# Patient Record
Sex: Female | Born: 1975 | Race: Black or African American | Hispanic: No | Marital: Single | Smoking: Never smoker
Health system: Southern US, Community
[De-identification: ages and names within clinical notes are randomized; demographics above are authoritative.]

## PROBLEM LIST (undated history)

## (undated) DIAGNOSIS — D509 Iron deficiency anemia, unspecified: Secondary | ICD-10-CM

## (undated) DIAGNOSIS — K589 Irritable bowel syndrome without diarrhea: Secondary | ICD-10-CM

## (undated) DIAGNOSIS — T7840XA Allergy, unspecified, initial encounter: Secondary | ICD-10-CM

## (undated) DIAGNOSIS — G43909 Migraine, unspecified, not intractable, without status migrainosus: Secondary | ICD-10-CM

## (undated) HISTORY — PX: CHOLECYSTECTOMY: SHX55

## (undated) HISTORY — PX: ANKLE ARTHROSCOPY: SUR85

---

## 1997-10-06 ENCOUNTER — Ambulatory Visit (HOSPITAL_COMMUNITY): Admission: RE | Admit: 1997-10-06 | Discharge: 1997-10-06 | Payer: Self-pay | Admitting: Gynecology

## 1997-11-01 ENCOUNTER — Other Ambulatory Visit: Admission: RE | Admit: 1997-11-01 | Discharge: 1997-11-01 | Payer: Self-pay | Admitting: Gynecology

## 1998-01-12 ENCOUNTER — Ambulatory Visit (HOSPITAL_COMMUNITY): Admission: RE | Admit: 1998-01-12 | Discharge: 1998-01-12 | Payer: Self-pay | Admitting: Internal Medicine

## 1998-01-19 ENCOUNTER — Ambulatory Visit (HOSPITAL_COMMUNITY): Admission: RE | Admit: 1998-01-19 | Discharge: 1998-01-19 | Payer: Self-pay | Admitting: Internal Medicine

## 1998-04-19 ENCOUNTER — Emergency Department (HOSPITAL_COMMUNITY): Admission: EM | Admit: 1998-04-19 | Discharge: 1998-04-19 | Payer: Self-pay | Admitting: Emergency Medicine

## 1998-07-03 ENCOUNTER — Emergency Department (HOSPITAL_COMMUNITY): Admission: EM | Admit: 1998-07-03 | Discharge: 1998-07-03 | Payer: Self-pay | Admitting: Emergency Medicine

## 1998-07-12 ENCOUNTER — Other Ambulatory Visit: Admission: RE | Admit: 1998-07-12 | Discharge: 1998-07-12 | Payer: Self-pay | Admitting: Gynecology

## 1999-05-10 ENCOUNTER — Other Ambulatory Visit: Admission: RE | Admit: 1999-05-10 | Discharge: 1999-05-10 | Payer: Self-pay | Admitting: Gynecology

## 1999-10-11 ENCOUNTER — Other Ambulatory Visit: Admission: RE | Admit: 1999-10-11 | Discharge: 1999-10-11 | Payer: Self-pay | Admitting: Internal Medicine

## 2000-05-21 ENCOUNTER — Other Ambulatory Visit: Admission: RE | Admit: 2000-05-21 | Discharge: 2000-05-21 | Payer: Self-pay | Admitting: Gynecology

## 2000-12-17 ENCOUNTER — Other Ambulatory Visit: Admission: RE | Admit: 2000-12-17 | Discharge: 2000-12-17 | Payer: Self-pay | Admitting: Gynecology

## 2001-08-11 ENCOUNTER — Other Ambulatory Visit: Admission: RE | Admit: 2001-08-11 | Discharge: 2001-08-11 | Payer: Self-pay | Admitting: Gynecology

## 2001-08-22 ENCOUNTER — Emergency Department (HOSPITAL_COMMUNITY): Admission: EM | Admit: 2001-08-22 | Discharge: 2001-08-23 | Payer: Self-pay | Admitting: Emergency Medicine

## 2001-08-23 ENCOUNTER — Encounter: Payer: Self-pay | Admitting: Emergency Medicine

## 2002-03-18 ENCOUNTER — Emergency Department (HOSPITAL_COMMUNITY): Admission: EM | Admit: 2002-03-18 | Discharge: 2002-03-19 | Payer: Self-pay | Admitting: Emergency Medicine

## 2002-05-24 ENCOUNTER — Ambulatory Visit (HOSPITAL_COMMUNITY): Admission: RE | Admit: 2002-05-24 | Discharge: 2002-05-24 | Payer: Self-pay | Admitting: Gynecology

## 2002-05-24 ENCOUNTER — Encounter: Payer: Self-pay | Admitting: Gynecology

## 2002-06-11 ENCOUNTER — Ambulatory Visit (HOSPITAL_COMMUNITY): Admission: RE | Admit: 2002-06-11 | Discharge: 2002-06-11 | Payer: Self-pay | Admitting: Gynecology

## 2018-01-20 ENCOUNTER — Emergency Department (HOSPITAL_COMMUNITY)
Admission: EM | Admit: 2018-01-20 | Discharge: 2018-01-20 | Disposition: A | Discharge status: Home / Self Care | Payer: Medicaid Other | Attending: Emergency Medicine | Admitting: Emergency Medicine

## 2018-01-20 ENCOUNTER — Encounter (HOSPITAL_COMMUNITY): Payer: Self-pay | Admitting: Emergency Medicine

## 2018-01-20 ENCOUNTER — Emergency Department (HOSPITAL_COMMUNITY): Payer: Medicaid Other

## 2018-01-20 ENCOUNTER — Other Ambulatory Visit: Payer: Self-pay

## 2018-01-20 DIAGNOSIS — S0592XA Unspecified injury of left eye and orbit, initial encounter: Secondary | ICD-10-CM | POA: Diagnosis not present

## 2018-01-20 DIAGNOSIS — S199XXA Unspecified injury of neck, initial encounter: Secondary | ICD-10-CM | POA: Diagnosis present

## 2018-01-20 DIAGNOSIS — Y929 Unspecified place or not applicable: Secondary | ICD-10-CM | POA: Diagnosis not present

## 2018-01-20 DIAGNOSIS — Y999 Unspecified external cause status: Secondary | ICD-10-CM | POA: Diagnosis not present

## 2018-01-20 DIAGNOSIS — Y939 Activity, unspecified: Secondary | ICD-10-CM | POA: Insufficient documentation

## 2018-01-20 LAB — I-STAT CHEM 8, ED
BUN: 7 mg/dL (ref 6–20)
Calcium, Ion: 1.2 mmol/L (ref 1.15–1.40)
Chloride: 103 mmol/L (ref 98–111)
Creatinine, Ser: 1.1 mg/dL — ABNORMAL HIGH (ref 0.44–1.00)
Glucose, Bld: 90 mg/dL (ref 70–99)
HCT: 37 % (ref 36.0–46.0)
Hemoglobin: 12.6 g/dL (ref 12.0–15.0)
Potassium: 3.4 mmol/L — ABNORMAL LOW (ref 3.5–5.1)
Sodium: 140 mmol/L (ref 135–145)
TCO2: 26 mmol/L (ref 22–32)

## 2018-01-20 LAB — I-STAT BETA HCG BLOOD, ED (MC, WL, AP ONLY): I-stat hCG, quantitative: <5 m[IU]/mL (ref <5)

## 2018-01-20 MED ORDER — IOPAMIDOL (ISOVUE-300) INJECTION 61%
75.0000 mL | Freq: Once | INTRAVENOUS | Status: AC | PRN
  Administered 2018-01-20: 75 mL via INTRAVENOUS
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
## 2018-01-20 NOTE — Discharge Instructions (Addendum)
Please read attached information. If you experience any new or worsening signs or symptoms please return to the emergency room for evaluation. Please follow-up with your primary care provider or specialist as discussed.  °

## 2018-01-20 NOTE — ED Notes (Signed)
ED Provider at bedside. 
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
## 2018-01-20 NOTE — ED Notes (Signed)
Patient verbalizes understanding of discharge instructions. Opportunity for questioning and answers were provided. Armband removed by staff, pt discharged from ED ambulatory.   
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
## 2018-02-02 ENCOUNTER — Encounter (HOSPITAL_COMMUNITY): Payer: Self-pay | Admitting: *Deleted

## 2018-02-02 ENCOUNTER — Emergency Department (HOSPITAL_COMMUNITY)
Admission: EM | Admit: 2018-02-02 | Discharge: 2018-02-02 | Disposition: A | Discharge status: Home / Self Care | Payer: Medicaid Other | Attending: Emergency Medicine | Admitting: Emergency Medicine

## 2018-02-02 DIAGNOSIS — J0101 Acute recurrent maxillary sinusitis: Secondary | ICD-10-CM | POA: Diagnosis not present

## 2018-02-02 DIAGNOSIS — J0100 Acute maxillary sinusitis, unspecified: Secondary | ICD-10-CM

## 2018-02-02 DIAGNOSIS — R05 Cough: Secondary | ICD-10-CM | POA: Diagnosis present

## 2018-02-02 MED ORDER — AMOXICILLIN-POT CLAVULANATE 875-125 MG PO TABS: 1.0000 | ORAL_TABLET | Freq: Two times a day (BID) | ORAL | 0 refills | Status: DC

## 2018-02-02 NOTE — ED Triage Notes (Signed)
Pt in stating she thinks she has a sinus infection, reports nasal congestion and facial pain, reports fever last night

## 2018-02-02 NOTE — Discharge Instructions (Addendum)
Please read attached information. If you experience any new or worsening signs or symptoms please return to the emergency room for evaluation. Please follow-up with your primary care provider or specialist as discussed. Please use medication prescribed only as directed and discontinue taking if you have any concerning signs or symptoms.   °

## 2018-02-02 NOTE — ED Provider Notes (Signed)
[facility] Provider Note   CSN: [id number] Arrival date & time: 02/02/18  1228     History   Chief Complaint Chief Complaint  Patient presents with  . Sinus Problem    HPI Alayah Vivi FernsM Uecker is a 42 y.o. female.  HPI   [age] year old female presents today with complaints of upper respiratory infection.  Patient notes symptoms started approximately 1 week ago with upper respiratory congestion and nasal pressure.  She notes this is localized to the left maxillary region, she notes cough with sputum production, she denies any associated shortness of breath, denies any fever.  Patient notes history of sinus infection in the past that presented similarly.  She was seen in urgent care and prescribed Flonase.  She notes she did not fill the prescription for the Flonase.  She denies any recent antibiotic exposure.  History reviewed. No pertinent past medical history.  There are no active problems to display for this patient.   Past Surgical History:  Procedure Laterality Date  . ANKLE ARTHROSCOPY    . CHOLECYSTECTOMY       OB History   None      Home Medications    Prior to Admission medications   Medication Sig Start Date End Date Taking? Authorizing Provider  amoxicillin-clavulanate (AUGMENTIN) 875-125 MG tablet Take 1 tablet by mouth every 12 (twelve) hours. 02/02/18   Eyvonne MechanicHedges, Charlestine Rookstool, PA-C    Family History History reviewed. No pertinent family history.  Social History Social History   Tobacco Use  . Smoking status: Never Smoker  . Smokeless tobacco: Never Used  Substance Use Topics  . Alcohol use: Never    Frequency: Never  . Drug use: Never     Allergies   Latex   Review of Systems Review of Systems  All other systems reviewed and are negative.   Physical Exam Updated Vital Signs BP (!) 154/91 (BP Location: Right Arm)   Pulse (!) 130   Temp 99 F (37.2 C) (Oral)   Resp 20   LMP 01/18/2018   SpO2 100%    Physical Exam  Constitutional: She is oriented to person, place, and time. She appears well-developed and well-nourished.  HENT:  Head: Normocephalic and atraumatic.  Bilateral TMs normal, oropharynx clear no swelling or edema or erythema-rhinorrhea nasal mucosal edema  Eyes: Pupils are equal, round, and reactive to light. Conjunctivae are normal. Right eye exhibits no discharge. Left eye exhibits no discharge. No scleral icterus.  Neck: Normal range of motion. No JVD present. No tracheal deviation present.  Pulmonary/Chest: Effort normal and breath sounds normal. No stridor. No respiratory distress. She has no wheezes. She has no rales. She exhibits no tenderness.  Neurological: She is alert and oriented to person, place, and time. Coordination normal.  Psychiatric: She has a normal mood and affect. Her behavior is normal. Judgment and thought content normal.  Nursing note and vitals reviewed.    ED Treatments / Results  Labs (all labs ordered are listed, but only abnormal results are displayed) Labs Reviewed - No data to display  EKG None  Radiology No results found.  Procedures Procedures (including critical care time)  Medications Ordered in ED Medications - No data to display   Initial Impression / Assessment and Plan / ED Course  I have reviewed the triage vital signs and the nursing notes.  Pertinent labs & imaging results that were available during my care of the patient were reviewed by me and considered in my  medical decision making (see chart for details).     42 year old female presents today with likely sinusitis.  She has had 1 week of symptoms they continue to worsen, concern for bacterial sinusitis.  Patient be treated with Augmentin, she has no signs of significant respiratory involvement low suspicion for pneumonia.  Patient discharged with antibiotics, Flonase, follow-up instructions and strict return precautions.  She verbalized understanding and  agreement to today's plan had no further questions or concerns.  Final Clinical Impressions(s) / ED Diagnoses   Final diagnoses:  Acute non-recurrent maxillary sinusitis    ED Discharge Orders         Ordered    amoxicillin-clavulanate (AUGMENTIN) 875-125 MG tablet  Every 12 hours     02/02/18 1343           Eyvonne Mechanic, PA-C 02/02/18 1345    Long, Arlyss Repress, MD 02/03/18 1108

## 2019-05-06 ENCOUNTER — Encounter (HOSPITAL_COMMUNITY): Payer: Self-pay | Admitting: Emergency Medicine

## 2019-05-06 ENCOUNTER — Emergency Department (HOSPITAL_COMMUNITY)
Admission: EM | Admit: 2019-05-06 | Discharge: 2019-05-06 | Disposition: A | Discharge status: Home / Self Care | Payer: Medicaid Other | Attending: Emergency Medicine | Admitting: Emergency Medicine

## 2019-05-06 DIAGNOSIS — Z91040 Latex allergy status: Secondary | ICD-10-CM | POA: Insufficient documentation

## 2019-05-06 DIAGNOSIS — J0100 Acute maxillary sinusitis, unspecified: Secondary | ICD-10-CM | POA: Diagnosis not present

## 2019-05-06 DIAGNOSIS — R519 Headache, unspecified: Secondary | ICD-10-CM | POA: Diagnosis present

## 2019-05-06 MED ORDER — AMOXICILLIN-POT CLAVULANATE 875-125 MG PO TABS: 1.0000 | ORAL_TABLET | Freq: Two times a day (BID) | ORAL | 0 refills | Status: DC

## 2019-05-06 MED ORDER — FLUCONAZOLE 150 MG PO TABS: 150.0000 mg | ORAL_TABLET | Freq: Every day | ORAL | 0 refills | Status: AC

## 2019-05-06 NOTE — Discharge Instructions (Signed)
Take Augmentin twice daily for 5 days. Take with food Use Flonase or Nasocort as directed to help open up the sinuses Please return if worsening

## 2019-05-06 NOTE — ED Provider Notes (Signed)
[facility] EMERGENCY DEPARTMENT Provider Note   CSN: [id number] Arrival date & time: 05/06/19  1037     History Chief Complaint  Patient presents with  . Facial Pain    Caroline Mcgee is a 43 y.o. female who presents with possible sinus infection.  She states that she gets recurrent sinusitis a couple times a year and symptoms feel exactly the same.  She reports congestion, her ears popping, facial pain, pressure behind her eyes.  Symptoms been going on for approximately 3 days.  She is blowing out yellow/green mucus.  She reports mild cough.  She denies fever, chest pain, shortness of breath.  She has not tried anything over-the-counter.  HPI     History reviewed. No pertinent past medical history.  There are no problems to display for this patient.   Past Surgical History:  Procedure Laterality Date  . ANKLE ARTHROSCOPY    . CHOLECYSTECTOMY       OB History   No obstetric history on file.     No family history on file.  Social History   Tobacco Use  . Smoking status: Never Smoker  . Smokeless tobacco: Never Used  Substance Use Topics  . Alcohol use: Never  . Drug use: Never    Home Medications Prior to Admission medications   Medication Sig Start Date End Date Taking? Authorizing Provider  amoxicillin-clavulanate (AUGMENTIN) 875-125 MG tablet Take 1 tablet by mouth every 12 (twelve) hours. 02/02/18   Okey Regal, PA-C    Allergies    Latex  Review of Systems   Review of Systems  Constitutional: Negative for fever.  HENT: Positive for congestion and sinus pressure.   Respiratory: Positive for cough.     Physical Exam Updated Vital Signs BP (!) 130/95 (BP Location: Right Arm)   Pulse 89   Temp 98.8 F (37.1 C) (Oral)   Resp 16   LMP 04/22/2019   SpO2 100%   Physical Exam Vitals and nursing note reviewed.  Constitutional:      General: She is not in acute distress.    Appearance: Normal appearance. She is  well-developed. She is not ill-appearing.  HENT:     Head: Normocephalic and atraumatic.     Right Ear: Tympanic membrane normal.     Left Ear: Tympanic membrane normal.     Nose: Mucosal edema and congestion present.     Right Sinus: Maxillary sinus tenderness present.     Left Sinus: Maxillary sinus tenderness present.     Mouth/Throat:     Lips: Pink.     Mouth: Mucous membranes are moist.     Pharynx: Oropharynx is clear.  Eyes:     General: No scleral icterus.       Right eye: No discharge.        Left eye: No discharge.     Conjunctiva/sclera: Conjunctivae normal.     Pupils: Pupils are equal, round, and reactive to light.  Cardiovascular:     Rate and Rhythm: Normal rate and regular rhythm.  Pulmonary:     Effort: Pulmonary effort is normal. No respiratory distress.     Breath sounds: Normal breath sounds.  Abdominal:     General: There is no distension.  Musculoskeletal:     Cervical back: Normal range of motion.  Skin:    General: Skin is warm and dry.  Neurological:     Mental Status: She is alert and oriented to person, place, and time.  Psychiatric:  Behavior: Behavior normal.     ED Results / Procedures / Treatments   Labs (all labs ordered are listed, but only abnormal results are displayed) Labs Reviewed - No data to display  EKG None  Radiology No results found.  Procedures Procedures (including critical care time)  Medications Ordered in ED Medications - No data to display  ED Course  I have reviewed the triage vital signs and the nursing notes.  Pertinent labs & imaging results that were available during my care of the patient were reviewed by me and considered in my medical decision making (see chart for details).  43 year old female presents with symptoms consistent with acute sinus infection.  Vitals are reassuring. She is overall well appearing. Although likely viral will cover for bacterial infection. Will rx Augmentin and advised  use with steroid nasal spray. She is also requesting rx for Diflucan which was provided.  MDM Rules/Calculators/A&P                       Final Clinical Impression(s) / ED Diagnoses Final diagnoses:  Acute non-recurrent maxillary sinusitis    Rx / DC Orders ED Discharge Orders    None       Bethel Born, PA-C 05/06/19 1249    Charlynne Pander, MD 05/06/19 [phone]

## 2019-05-06 NOTE — ED Triage Notes (Signed)
Pt. Stated, I have a sinus infection, pain around my eyes , blowing out yellow stuff , trying to stay on top before it goes to bronchitis.

## 2019-05-06 NOTE — ED Notes (Signed)
Patient verbalizes understanding of discharge instructions. Opportunity for questioning and answers were provided.  pt discharged from ED, ambulatory by self   

## 2020-07-20 ENCOUNTER — Emergency Department (HOSPITAL_BASED_OUTPATIENT_CLINIC_OR_DEPARTMENT_OTHER): Payer: Medicaid Other

## 2020-07-20 ENCOUNTER — Encounter (HOSPITAL_BASED_OUTPATIENT_CLINIC_OR_DEPARTMENT_OTHER): Payer: Self-pay

## 2020-07-20 ENCOUNTER — Inpatient Hospital Stay (HOSPITAL_BASED_OUTPATIENT_CLINIC_OR_DEPARTMENT_OTHER)
Admission: EM | Admit: 2020-07-20 | Discharge: 2020-07-24 | DRG: 076 | Disposition: A | Discharge status: Home / Self Care | Payer: Medicaid Other | Attending: Internal Medicine | Admitting: Internal Medicine

## 2020-07-20 ENCOUNTER — Other Ambulatory Visit: Payer: Self-pay

## 2020-07-20 DIAGNOSIS — A878 Other viral meningitis: Principal | ICD-10-CM | POA: Diagnosis present

## 2020-07-20 DIAGNOSIS — Z9049 Acquired absence of other specified parts of digestive tract: Secondary | ICD-10-CM

## 2020-07-20 DIAGNOSIS — Z91040 Latex allergy status: Secondary | ICD-10-CM

## 2020-07-20 DIAGNOSIS — G039 Meningitis, unspecified: Secondary | ICD-10-CM | POA: Diagnosis present

## 2020-07-20 DIAGNOSIS — Z20822 Contact with and (suspected) exposure to covid-19: Secondary | ICD-10-CM | POA: Diagnosis present

## 2020-07-20 DIAGNOSIS — E876 Hypokalemia: Secondary | ICD-10-CM | POA: Diagnosis present

## 2020-07-20 DIAGNOSIS — E669 Obesity, unspecified: Secondary | ICD-10-CM | POA: Diagnosis present

## 2020-07-20 DIAGNOSIS — Z6834 Body mass index (BMI) 34.0-34.9, adult: Secondary | ICD-10-CM

## 2020-07-20 DIAGNOSIS — D72829 Elevated white blood cell count, unspecified: Secondary | ICD-10-CM | POA: Diagnosis present

## 2020-07-20 DIAGNOSIS — K589 Irritable bowel syndrome without diarrhea: Secondary | ICD-10-CM | POA: Diagnosis present

## 2020-07-20 DIAGNOSIS — E282 Polycystic ovarian syndrome: Secondary | ICD-10-CM | POA: Diagnosis present

## 2020-07-20 DIAGNOSIS — D509 Iron deficiency anemia, unspecified: Secondary | ICD-10-CM | POA: Diagnosis present

## 2020-07-20 HISTORY — DX: Iron deficiency anemia, unspecified: D50.9

## 2020-07-20 HISTORY — DX: Migraine, unspecified, not intractable, without status migrainosus: G43.909

## 2020-07-20 HISTORY — DX: Irritable bowel syndrome without diarrhea: K58.9

## 2020-07-20 HISTORY — DX: Allergy, unspecified, initial encounter: T78.40XA

## 2020-07-20 LAB — CBC WITH DIFFERENTIAL/PLATELET
Abs Immature Granulocytes: 0.1 10*3/uL — ABNORMAL HIGH (ref 0.00–0.07)
Basophils Absolute: 0 10*3/uL (ref 0.0–0.1)
Basophils Relative: 0 %
Eosinophils Absolute: 0 10*3/uL (ref 0.0–0.5)
Eosinophils Relative: 0 %
HCT: 35.6 % — ABNORMAL LOW (ref 36.0–46.0)
Hemoglobin: 11.3 g/dL — ABNORMAL LOW (ref 12.0–15.0)
Immature Granulocytes: 1 %
Lymphocytes Relative: 7 %
Lymphs Abs: 1.5 10*3/uL (ref 0.7–4.0)
MCH: 23.7 pg — ABNORMAL LOW (ref 26.0–34.0)
MCHC: 31.7 g/dL (ref 30.0–36.0)
MCV: 74.6 fL — ABNORMAL LOW (ref 80.0–100.0)
Monocytes Absolute: 0.7 10*3/uL (ref 0.1–1.0)
Monocytes Relative: 3 %
Neutro Abs: 18.6 10*3/uL — ABNORMAL HIGH (ref 1.7–7.7)
Neutrophils Relative %: 89 %
Platelets: 556 10*3/uL — ABNORMAL HIGH (ref 150–400)
RBC: 4.77 MIL/uL (ref 3.87–5.11)
RDW: 17.9 % — ABNORMAL HIGH (ref 11.5–15.5)
WBC: 20.9 10*3/uL — ABNORMAL HIGH (ref 4.0–10.5)
nRBC: 0 % (ref 0.0–0.2)

## 2020-07-20 LAB — COMPREHENSIVE METABOLIC PANEL
ALT: 13 U/L (ref 0–44)
AST: 16 U/L (ref 15–41)
Albumin: 4.2 g/dL (ref 3.5–5.0)
Alkaline Phosphatase: 61 U/L (ref 38–126)
Anion gap: 12 (ref 5–15)
BUN: 11 mg/dL (ref 6–20)
CO2: 22 mmol/L (ref 22–32)
Calcium: 9.1 mg/dL (ref 8.9–10.3)
Chloride: 102 mmol/L (ref 98–111)
Creatinine, Ser: 1 mg/dL (ref 0.44–1.00)
GFR, Estimated: >60 mL/min (ref >60)
Glucose, Bld: 120 mg/dL — ABNORMAL HIGH (ref 70–99)
Potassium: 3.6 mmol/L (ref 3.5–5.1)
Sodium: 136 mmol/L (ref 135–145)
Total Bilirubin: 0.5 mg/dL (ref 0.3–1.2)
Total Protein: 7.6 g/dL (ref 6.5–8.1)

## 2020-07-20 LAB — RESP PANEL BY RT-PCR (FLU A&B, COVID) ARPGX2
Influenza A by PCR: NEGATIVE
Influenza B by PCR: NEGATIVE
SARS Coronavirus 2 by RT PCR: NEGATIVE

## 2020-07-20 LAB — CK: Total CK: 80 U/L (ref 38–234)

## 2020-07-20 LAB — LACTIC ACID, PLASMA: Lactic Acid, Venous: 1.2 mmol/L (ref 0.5–1.9)

## 2020-07-20 LAB — HCG, SERUM, QUALITATIVE: Preg, Serum: NEGATIVE

## 2020-07-20 MED ORDER — FENTANYL CITRATE (PF) 100 MCG/2ML IJ SOLN: 50.0000 ug | Freq: Once | INTRAMUSCULAR | Status: AC

## 2020-07-20 MED ORDER — LIDOCAINE HCL (PF) 1 % IJ SOLN
20.0000 mL | Freq: Once | INTRAMUSCULAR | Status: AC
  Administered 2020-07-20: 20 mL

## 2020-07-20 MED ORDER — DIPHENHYDRAMINE HCL 50 MG/ML IJ SOLN
12.5000 mg | Freq: Once | INTRAMUSCULAR | Status: AC
  Administered 2020-07-20: 12.5 mg via INTRAVENOUS
  Filled 2020-07-20: qty 1

## 2020-07-20 MED ORDER — SODIUM CHLORIDE 0.9 % IV SOLN
INTRAVENOUS | Status: DC | PRN
  Administered 2020-07-20: 500 mL via INTRAVENOUS
  Administered 2020-07-21: 1000 mL via INTRAVENOUS

## 2020-07-20 MED ORDER — VANCOMYCIN HCL IN DEXTROSE 1-5 GM/200ML-% IV SOLN
1000.0000 mg | INTRAVENOUS | Status: AC
  Administered 2020-07-21 (×2): 1000 mg via INTRAVENOUS
  Filled 2020-07-20 (×2): qty 200

## 2020-07-20 MED ORDER — LIDOCAINE HCL (PF) 1 % IJ SOLN
INTRAMUSCULAR | Status: AC
  Filled 2020-07-20: qty 20

## 2020-07-20 MED ORDER — LIDOCAINE HCL 2 % IJ SOLN
10.0000 mL | Freq: Once | INTRAMUSCULAR | Status: AC
  Administered 2020-07-20: 200 mg via INTRADERMAL
  Filled 2020-07-20: qty 20

## 2020-07-20 MED ORDER — FENTANYL CITRATE (PF) 100 MCG/2ML IJ SOLN
INTRAMUSCULAR | Status: AC
  Administered 2020-07-20: 50 ug via INTRAVENOUS
  Filled 2020-07-20: qty 2

## 2020-07-20 MED ORDER — PROCHLORPERAZINE EDISYLATE 10 MG/2ML IJ SOLN
5.0000 mg | Freq: Once | INTRAMUSCULAR | Status: AC
  Administered 2020-07-20: 5 mg via INTRAVENOUS
  Filled 2020-07-20: qty 2

## 2020-07-20 MED ORDER — DEXTROSE 5 % IV SOLN
800.0000 mg | Freq: Once | INTRAVENOUS | Status: AC
  Administered 2020-07-20: 800 mg via INTRAVENOUS
  Filled 2020-07-20: qty 16

## 2020-07-20 MED ORDER — KETOROLAC TROMETHAMINE 30 MG/ML IJ SOLN
15.0000 mg | Freq: Once | INTRAMUSCULAR | Status: AC
  Administered 2020-07-20: 15 mg via INTRAVENOUS
  Filled 2020-07-20: qty 1

## 2020-07-20 MED ORDER — LIDOCAINE HCL (PF) 1 % IJ SOLN
INTRAMUSCULAR | Status: AC
  Filled 2020-07-20: qty 10

## 2020-07-20 MED ORDER — SODIUM CHLORIDE 0.9 % IV SOLN
2.0000 g | Freq: Once | INTRAVENOUS | Status: AC
  Administered 2020-07-20: 2 g via INTRAVENOUS
  Filled 2020-07-20: qty 20

## 2020-07-20 MED ORDER — SODIUM CHLORIDE 0.9 % IV BOLUS
1000.0000 mL | Freq: Once | INTRAVENOUS | Status: AC
  Administered 2020-07-20: 1000 mL via INTRAVENOUS

## 2020-07-20 MED ORDER — ACYCLOVIR SODIUM 50 MG/ML IV SOLN
INTRAVENOUS | Status: AC
  Administered 2020-07-20: 800 mg
  Filled 2020-07-20: qty 10

## 2020-07-20 MED ORDER — ACETAMINOPHEN 500 MG PO TABS
1000.0000 mg | ORAL_TABLET | Freq: Once | ORAL | Status: AC
  Administered 2020-07-20: 1000 mg via ORAL
  Filled 2020-07-20: qty 2

## 2020-07-20 NOTE — ED Provider Notes (Signed)
[facility] EMERGENCY DEPARTMENT Provider Note   CSN: [id number] Arrival date & time: 07/20/20  1936     History Chief Complaint  Patient presents with  . Generalized Body Aches    Caroline Mcgee is a 45 y.o. female.  HPI    45 y/o female who presents to the ED today for eval of generalized body aches.  She states that her symptoms started 3 days ago with bilateral ankle pain.  By the following day she had diffuse body aches mid neck pain/stiffness.  She reports associated photophobia, nausea, vomiting, headache. She has been taking tylenol and aleve. States tylenol has been helping symptoms.  States she also took some leftover antibiotics which she felt helped her headache.   Denies cough, sneezing, rhinorrhea, congestion, sore throat, or other systemic sxs.  Pt has had migraines in the past but this feels different. She did have an episode with similar sxs many years ago which resolved on its own.   She has not had her covid vaccines.   History reviewed. No pertinent past medical history.  There are no problems to display for this patient.   Past Surgical History:  Procedure Laterality Date  . ANKLE ARTHROSCOPY    . CHOLECYSTECTOMY       OB History   No obstetric history on file.     No family history on file.  Social History   Tobacco Use  . Smoking status: Never Smoker  . Smokeless tobacco: Never Used  Vaping Use  . Vaping Use: Never used  Substance Use Topics  . Alcohol use: Never  . Drug use: Never    Home Medications Prior to Admission medications   Medication Sig Start Date End Date Taking? Authorizing Provider  amoxicillin-clavulanate (AUGMENTIN) 875-125 MG tablet Take 1 tablet by mouth 2 (two) times daily. 05/06/19   Bethel Born, PA-C    Allergies    Latex  Review of Systems   Review of Systems  Constitutional: Positive for chills and diaphoresis. Negative for fever.  HENT: Negative for congestion, ear pain, rhinorrhea  and sore throat.   Eyes: Positive for photophobia. Negative for visual disturbance.  Respiratory: Negative for cough and shortness of breath.   Cardiovascular: Negative for chest pain.  Gastrointestinal: Positive for nausea and vomiting. Negative for abdominal pain, constipation and diarrhea.  Genitourinary: Negative for dysuria and hematuria.  Musculoskeletal: Positive for neck pain and neck stiffness. Negative for back pain.  Skin: Negative for rash.  Neurological: Positive for headaches.  All other systems reviewed and are negative.   Physical Exam Updated Vital Signs BP 131/79   Pulse 97   Temp 99.6 F (37.6 C) (Oral)   Resp 17   Ht 5\' 8"  (1.727 m)   Wt 104.3 kg   LMP 07/10/2020   SpO2 96%   BMI 34.97 kg/m   Physical Exam Vitals and nursing note reviewed.  Constitutional:      General: She is not in acute distress.    Appearance: She is well-developed.  HENT:     Head: Normocephalic and atraumatic.  Eyes:     Extraocular Movements: Extraocular movements intact.     Conjunctiva/sclera: Conjunctivae normal.     Pupils: Pupils are equal, round, and reactive to light.  Neck:     Comments: ttp to the bilat cervical paraspinous muscles and midline of the neck Cardiovascular:     Rate and Rhythm: Regular rhythm. Tachycardia present.     Heart sounds: Normal heart sounds.  No murmur heard.   Pulmonary:     Effort: Pulmonary effort is normal. No respiratory distress.     Breath sounds: Normal breath sounds. No wheezing, rhonchi or rales.  Abdominal:     Palpations: Abdomen is soft.     Tenderness: There is no abdominal tenderness.  Musculoskeletal:     Cervical back: Rigidity and tenderness present.  Skin:    General: Skin is warm and dry.  Neurological:     Mental Status: She is alert.     Comments: Mental Status:  Alert, thought content appropriate, able to give a coherent history. Speech fluent without evidence of aphasia. Able to follow 2 step commands without  difficulty.  Cranial Nerves:  II: pupils equal, round, reactive to light III,IV, VI: ptosis not present, extra-ocular motions intact bilaterally  V,VII: smile symmetric, facial light touch sensation equal VIII: hearing grossly normal to voice  X: uvula elevates symmetrically  XI: bilateral shoulder shrug symmetric and strong XII: midline tongue extension without fassiculations Motor:  Normal tone. 5/5 strength of BUE and BLE major muscle groups including strong and equal grip strength and dorsiflexion/plantar flexion Sensory: light touch normal in all extremities.      ED Results / Procedures / Treatments   Labs (all labs ordered are listed, but only abnormal results are displayed) Labs Reviewed  CBC WITH DIFFERENTIAL/PLATELET - Abnormal; Notable for the following components:      Result Value   WBC 20.9 (*)    Hemoglobin 11.3 (*)    HCT 35.6 (*)    MCV 74.6 (*)    MCH 23.7 (*)    RDW 17.9 (*)    Platelets 556 (*)    Neutro Abs 18.6 (*)    Abs Immature Granulocytes 0.10 (*)    All other components within normal limits  COMPREHENSIVE METABOLIC PANEL - Abnormal; Notable for the following components:   Glucose, Bld 120 (*)    All other components within normal limits  RESP PANEL BY RT-PCR (FLU A&B, COVID) ARPGX2  CULTURE, BLOOD (ROUTINE X 2)  CULTURE, BLOOD (ROUTINE X 2)  CSF CULTURE W GRAM STAIN  GRAM STAIN  HCG, SERUM, QUALITATIVE  CK  LACTIC ACID, PLASMA  LACTIC ACID, PLASMA  HERPES SIMPLEX VIRUS(HSV) DNA BY PCR  HIV ANTIBODY (ROUTINE TESTING W REFLEX)  URINALYSIS, ROUTINE W REFLEX MICROSCOPIC    EKG None  Radiology CT Head Wo Contrast  Result Date: 07/20/2020 CLINICAL DATA:  Migraine headaches and fevers EXAM: CT HEAD WITHOUT CONTRAST TECHNIQUE: Contiguous axial images were obtained from the base of the skull through the vertex without intravenous contrast. COMPARISON:  None. FINDINGS: Brain: No evidence of acute infarction, hemorrhage, hydrocephalus,  extra-axial collection or mass lesion/mass effect. Vascular: No hyperdense vessel or unexpected calcification. Skull: Normal. Negative for fracture or focal lesion. Sinuses/Orbits: No acute finding. Other: None. IMPRESSION: No acute abnormality noted. Electronically Signed   By: Alcide Clever M.D.   On: 07/20/2020 23:30    Procedures Procedures   CRITICAL CARE Performed by: Karrie Meres   Total critical care time: 45 minutes  Critical care time was exclusive of separately billable procedures and treating other patients.  Critical care was necessary to treat or prevent imminent or life-threatening deterioration.  Critical care was time spent personally by me on the following activities: development of treatment plan with patient and/or surrogate as well as nursing, discussions with consultants, evaluation of patient's response to treatment, examination of patient, obtaining history from patient or surrogate, ordering and  performing treatments and interventions, ordering and review of laboratory studies, ordering and review of radiographic studies, pulse oximetry and re-evaluation of patient's condition.   Medications Ordered in ED Medications  0.9 %  sodium chloride infusion (500 mLs Intravenous New Bag/Given 07/20/20 2202)  lidocaine (PF) (XYLOCAINE) 1 % injection (has no administration in time range)  lidocaine (PF) (XYLOCAINE) 1 % injection (has no administration in time range)  acyclovir (ZOVIRAX) 800 mg in dextrose 5 % 250 mL IVPB (800 mg Intravenous New Bag/Given 07/20/20 2346)  vancomycin (VANCOCIN) IVPB 1000 mg/200 mL premix (has no administration in time range)  0.9 %  sodium chloride infusion (has no administration in time range)  acetaminophen (TYLENOL) tablet 1,000 mg (1,000 mg Oral Given 07/20/20 2102)  sodium chloride 0.9 % bolus 1,000 mL (0 mLs Intravenous Stopped 07/20/20 2230)  ketorolac (TORADOL) 30 MG/ML injection 15 mg (15 mg Intravenous Given 07/20/20 2121)   diphenhydrAMINE (BENADRYL) injection 12.5 mg (12.5 mg Intravenous Given 07/20/20 2121)  prochlorperazine (COMPAZINE) injection 5 mg (5 mg Intravenous Given 07/20/20 2121)  lidocaine (XYLOCAINE) 2 % (with pres) injection 200 mg (200 mg Intradermal Given 07/20/20 2205)  cefTRIAXone (ROCEPHIN) 2 g in sodium chloride 0.9 % 100 mL IVPB (0 g Intravenous Stopped 07/20/20 2240)  lidocaine (PF) (XYLOCAINE) 1 % injection 20 mL (20 mLs Infiltration Given by Other 07/20/20 2250)  fentaNYL (SUBLIMAZE) injection 50 mcg (50 mcg Intravenous Given 07/20/20 2248)  acyclovir (ZOVIRAX) 50 MG/ML injection (800 mg  Given 07/20/20 2351)    ED Course  I have reviewed the triage vital signs and the nursing notes.  Pertinent labs & imaging results that were available during my care of the patient were reviewed by me and considered in my medical decision making (see chart for details).    MDM Rules/Calculators/A&P                          45 year old female presenting the emergency department today for evaluation of fevers, generalized body aches and neck pain/stiffness.  On my evaluation I repeated the patient's temperature which was noted to be 100.58F.  She is tachycardic on my evaluation as well with stable blood pressures and satting well on room air.  Will initiate work-up with labs,, Covid test  Reviewed/interpreted labs CBC shows significant leukocytosis of 20,000, mild anemia and thrombocytosis CMP is grossly unremarkable CK is negative Pregnancy test is negative Covid test is negative Lactic acid negative  CT head neg  Given patient's significant leukocytosis, fevers, headaches and neck rigidity, LP was performed in order to rule out meningitis.  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  Ceftriaxone and and acyclovir ordered.  Informed by lab that CSF samples have clotted but they will attempt to run culture.  Feel patient will require admission for  continued antibiotics and to wait for CSF cultures.  If not improving with intervention may require repeat tap.  12:27 PM CONSUL with Dr. Leafy Half with hospitalist service who accepts patient for admission at [facility]  Final Clinical Impression(s) / ED Diagnoses Final diagnoses:  Meningitis    Rx / DC Orders ED Discharge Orders    None       Karrie Meres, PA-C 07/21/20 0028    Charlynne Pander, MD 07/24/20 1654

## 2020-07-20 NOTE — ED Provider Notes (Incomplete)
[facility] EMERGENCY DEPARTMENT Provider Note   CSN: [id number] Arrival date & time: 07/20/20  1936     History Chief Complaint  Patient presents with  . Generalized Body Aches    Caroline Mcgee is a 45 y.o. female.  HPI    45 y/o female who presents to the ED today for eval of generalized body aches.  She states that her symptoms started 3 days ago with bilateral ankle pain.  By the following day she had diffuse body aches mid neck pain/stiffness.  She reports associated photophobia, nausea, vomiting, headache. She has been taking tylenol and aleve. States tylenol has been helping symptoms.  States she also took some leftover antibiotics which she felt helped her headache.   Denies cough, sneezing, rhinorrhea, congestion, sore throat, or other systemic sxs.  Pt has had migraines in the past but this feels different. She did have an episode with similar sxs many years ago which resolved on its own.   She has not had her covid vaccines.   History reviewed. No pertinent past medical history.  There are no problems to display for this patient.   Past Surgical History:  Procedure Laterality Date  . ANKLE ARTHROSCOPY    . CHOLECYSTECTOMY       OB History   No obstetric history on file.     No family history on file.  Social History   Tobacco Use  . Smoking status: Never Smoker  . Smokeless tobacco: Never Used  Vaping Use  . Vaping Use: Never used  Substance Use Topics  . Alcohol use: Never  . Drug use: Never    Home Medications Prior to Admission medications   Medication Sig Start Date End Date Taking? Authorizing Provider  amoxicillin-clavulanate (AUGMENTIN) 875-125 MG tablet Take 1 tablet by mouth 2 (two) times daily. 05/06/19   Bethel Born, PA-C    Allergies    Latex  Review of Systems   Review of Systems  Constitutional: Positive for chills and diaphoresis. Negative for fever.  HENT: Negative for congestion, ear pain, rhinorrhea  and sore throat.   Eyes: Positive for photophobia. Negative for visual disturbance.  Respiratory: Negative for cough and shortness of breath.   Cardiovascular: Negative for chest pain.  Gastrointestinal: Positive for nausea and vomiting. Negative for abdominal pain, constipation and diarrhea.  Genitourinary: Negative for dysuria and hematuria.  Musculoskeletal: Positive for neck pain and neck stiffness. Negative for back pain.  Skin: Negative for rash.  Neurological: Positive for headaches.  All other systems reviewed and are negative.   Physical Exam Updated Vital Signs BP 131/79   Pulse 97   Temp 99.6 F (37.6 C) (Oral)   Resp 17   Ht 5\' 8"  (1.727 m)   Wt 104.3 kg   LMP 07/10/2020   SpO2 96%   BMI 34.97 kg/m   Physical Exam Vitals and nursing note reviewed.  Constitutional:      General: She is not in acute distress.    Appearance: She is well-developed.  HENT:     Head: Normocephalic and atraumatic.  Eyes:     Extraocular Movements: Extraocular movements intact.     Conjunctiva/sclera: Conjunctivae normal.     Pupils: Pupils are equal, round, and reactive to light.  Neck:     Comments: ttp to the bilat cervical paraspinous muscles and midline of the neck Cardiovascular:     Rate and Rhythm: Regular rhythm. Tachycardia present.     Heart sounds: Normal heart sounds.  No murmur heard.   Pulmonary:     Effort: Pulmonary effort is normal. No respiratory distress.     Breath sounds: Normal breath sounds. No wheezing, rhonchi or rales.  Abdominal:     Palpations: Abdomen is soft.     Tenderness: There is no abdominal tenderness.  Musculoskeletal:     Cervical back: Rigidity and tenderness present.  Skin:    General: Skin is warm and dry.  Neurological:     Mental Status: She is alert.     Comments: Mental Status:  Alert, thought content appropriate, able to give a coherent history. Speech fluent without evidence of aphasia. Able to follow 2 step commands without  difficulty.  Cranial Nerves:  II: pupils equal, round, reactive to light III,IV, VI: ptosis not present, extra-ocular motions intact bilaterally  V,VII: smile symmetric, facial light touch sensation equal VIII: hearing grossly normal to voice  X: uvula elevates symmetrically  XI: bilateral shoulder shrug symmetric and strong XII: midline tongue extension without fassiculations Motor:  Normal tone. 5/5 strength of BUE and BLE major muscle groups including strong and equal grip strength and dorsiflexion/plantar flexion Sensory: light touch normal in all extremities.      ED Results / Procedures / Treatments   Labs (all labs ordered are listed, but only abnormal results are displayed) Labs Reviewed  CBC WITH DIFFERENTIAL/PLATELET - Abnormal; Notable for the following components:      Result Value   WBC 20.9 (*)    Hemoglobin 11.3 (*)    HCT 35.6 (*)    MCV 74.6 (*)    MCH 23.7 (*)    RDW 17.9 (*)    Platelets 556 (*)    Neutro Abs 18.6 (*)    Abs Immature Granulocytes 0.10 (*)    All other components within normal limits  COMPREHENSIVE METABOLIC PANEL - Abnormal; Notable for the following components:   Glucose, Bld 120 (*)    All other components within normal limits  RESP PANEL BY RT-PCR (FLU A&B, COVID) ARPGX2  CULTURE, BLOOD (ROUTINE X 2)  CULTURE, BLOOD (ROUTINE X 2)  CSF CULTURE W GRAM STAIN  GRAM STAIN  HCG, SERUM, QUALITATIVE  CK  LACTIC ACID, PLASMA  LACTIC ACID, PLASMA  PROTEIN AND GLUCOSE, CSF  HERPES SIMPLEX VIRUS(HSV) DNA BY PCR  HIV ANTIBODY (ROUTINE TESTING W REFLEX)  URINALYSIS, ROUTINE W REFLEX MICROSCOPIC    EKG None  Radiology CT Head Wo Contrast  Result Date: 07/20/2020 CLINICAL DATA:  Migraine headaches and fevers EXAM: CT HEAD WITHOUT CONTRAST TECHNIQUE: Contiguous axial images were obtained from the base of the skull through the vertex without intravenous contrast. COMPARISON:  None. FINDINGS: Brain: No evidence of acute infarction,  hemorrhage, hydrocephalus, extra-axial collection or mass lesion/mass effect. Vascular: No hyperdense vessel or unexpected calcification. Skull: Normal. Negative for fracture or focal lesion. Sinuses/Orbits: No acute finding. Other: None. IMPRESSION: No acute abnormality noted. Electronically Signed   By: Alcide Clever M.D.   On: 07/20/2020 23:30    Procedures Procedures   CRITICAL CARE Performed by: Karrie Meres   Total critical care time: 45 minutes  Critical care time was exclusive of separately billable procedures and treating other patients.  Critical care was necessary to treat or prevent imminent or life-threatening deterioration.  Critical care was time spent personally by me on the following activities: development of treatment plan with patient and/or surrogate as well as nursing, discussions with consultants, evaluation of patient's response to treatment, examination of patient, obtaining history from  patient or surrogate, ordering and performing treatments and interventions, ordering and review of laboratory studies, ordering and review of radiographic studies, pulse oximetry and re-evaluation of patient's condition.   Medications Ordered in ED Medications  0.9 %  sodium chloride infusion (500 mLs Intravenous New Bag/Given 07/20/20 2202)  lidocaine (PF) (XYLOCAINE) 1 % injection (has no administration in time range)  lidocaine (PF) (XYLOCAINE) 1 % injection (has no administration in time range)  acyclovir (ZOVIRAX) 800 mg in dextrose 5 % 250 mL IVPB (800 mg Intravenous New Bag/Given 07/20/20 2346)  vancomycin (VANCOCIN) IVPB 1000 mg/200 mL premix (has no administration in time range)  acetaminophen (TYLENOL) tablet 1,000 mg (1,000 mg Oral Given 07/20/20 2102)  sodium chloride 0.9 % bolus 1,000 mL (0 mLs Intravenous Stopped 07/20/20 2230)  ketorolac (TORADOL) 30 MG/ML injection 15 mg (15 mg Intravenous Given 07/20/20 2121)  diphenhydrAMINE (BENADRYL) injection 12.5 mg (12.5 mg  Intravenous Given 07/20/20 2121)  prochlorperazine (COMPAZINE) injection 5 mg (5 mg Intravenous Given 07/20/20 2121)  lidocaine (XYLOCAINE) 2 % (with pres) injection 200 mg (200 mg Intradermal Given 07/20/20 2205)  cefTRIAXone (ROCEPHIN) 2 g in sodium chloride 0.9 % 100 mL IVPB (0 g Intravenous Stopped 07/20/20 2240)  lidocaine (PF) (XYLOCAINE) 1 % injection 20 mL (20 mLs Infiltration Given by Other 07/20/20 2250)  fentaNYL (SUBLIMAZE) injection 50 mcg (50 mcg Intravenous Given 07/20/20 2248)  acyclovir (ZOVIRAX) 50 MG/ML injection (800 mg  Given 07/20/20 2351)    ED Course  I have reviewed the triage vital signs and the nursing notes.  Pertinent labs & imaging results that were available during my care of the patient were reviewed by me and considered in my medical decision making (see chart for details).    MDM Rules/Calculators/A&P                          45 year old female presenting the emergency department today for evaluation of fevers, generalized body aches and neck pain/stiffness.  On my evaluation I repeated the patient's temperature which was noted to be 100.61F.  She is tachycardic on my evaluation as well with stable blood pressures and satting well on room air.  Will initiate work-up with labs,, Covid test  Reviewed/interpreted labs CBC shows significant leukocytosis of 20,000, mild anemia and thrombocytosis CMP is grossly unremarkable CK is negative Pregnancy test is negative Covid test is negative Lactic acid negative  CT head neg  Given patient's significant leukocytosis, fevers, headaches and neck rigidity, LP was performed in order to rule out meningitis.  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  Ceftriaxone and and acyclovir ordered.  Informed by lab that CSF samples have clotted but they will attempt to run culture.  Feel patient will require admission for continued antibiotics and to wait for CSF cultures.  If  not improving with intervention may require repeat tap.  *** CONSUL with Dr. Marland Kitchen with hospitalist service who accepts patient for admission   Final Clinical Impression(s) / ED Diagnoses Final diagnoses:  None    Rx / DC Orders ED Discharge Orders    None

## 2020-07-20 NOTE — ED Triage Notes (Signed)
Pt c/o body aches, HA, stiffness in neck and back-vomited x 1-sx started 2 days ago-NAD-steady gait

## 2020-07-21 ENCOUNTER — Encounter (HOSPITAL_COMMUNITY): Payer: Self-pay | Admitting: Internal Medicine

## 2020-07-21 DIAGNOSIS — A879 Viral meningitis, unspecified: Secondary | ICD-10-CM | POA: Diagnosis not present

## 2020-07-21 DIAGNOSIS — M7989 Other specified soft tissue disorders: Secondary | ICD-10-CM | POA: Diagnosis not present

## 2020-07-21 DIAGNOSIS — G039 Meningitis, unspecified: Secondary | ICD-10-CM | POA: Diagnosis present

## 2020-07-21 DIAGNOSIS — E876 Hypokalemia: Secondary | ICD-10-CM | POA: Diagnosis present

## 2020-07-21 DIAGNOSIS — Z91040 Latex allergy status: Secondary | ICD-10-CM | POA: Diagnosis not present

## 2020-07-21 DIAGNOSIS — D72829 Elevated white blood cell count, unspecified: Secondary | ICD-10-CM | POA: Diagnosis present

## 2020-07-21 DIAGNOSIS — R519 Headache, unspecified: Secondary | ICD-10-CM | POA: Diagnosis not present

## 2020-07-21 DIAGNOSIS — Z9049 Acquired absence of other specified parts of digestive tract: Secondary | ICD-10-CM | POA: Diagnosis not present

## 2020-07-21 DIAGNOSIS — A878 Other viral meningitis: Secondary | ICD-10-CM | POA: Diagnosis not present

## 2020-07-21 DIAGNOSIS — D509 Iron deficiency anemia, unspecified: Secondary | ICD-10-CM | POA: Diagnosis present

## 2020-07-21 DIAGNOSIS — E282 Polycystic ovarian syndrome: Secondary | ICD-10-CM | POA: Diagnosis present

## 2020-07-21 DIAGNOSIS — K589 Irritable bowel syndrome without diarrhea: Secondary | ICD-10-CM | POA: Diagnosis present

## 2020-07-21 DIAGNOSIS — E669 Obesity, unspecified: Secondary | ICD-10-CM | POA: Diagnosis present

## 2020-07-21 DIAGNOSIS — Z6834 Body mass index (BMI) 34.0-34.9, adult: Secondary | ICD-10-CM | POA: Diagnosis not present

## 2020-07-21 DIAGNOSIS — Z20822 Contact with and (suspected) exposure to covid-19: Secondary | ICD-10-CM | POA: Diagnosis present

## 2020-07-21 DIAGNOSIS — T3695XA Adverse effect of unspecified systemic antibiotic, initial encounter: Secondary | ICD-10-CM | POA: Diagnosis not present

## 2020-07-21 LAB — URINALYSIS, ROUTINE W REFLEX MICROSCOPIC
Bilirubin Urine: NEGATIVE
Glucose, UA: NEGATIVE mg/dL
Ketones, ur: NEGATIVE mg/dL
Leukocytes,Ua: NEGATIVE
Nitrite: NEGATIVE
Protein, ur: NEGATIVE mg/dL
Specific Gravity, Urine: 1.02 (ref 1.005–1.030)
pH: 6 (ref 5.0–8.0)

## 2020-07-21 LAB — HIV ANTIBODY (ROUTINE TESTING W REFLEX): HIV Screen 4th Generation wRfx: NONREACTIVE

## 2020-07-21 LAB — URINALYSIS, MICROSCOPIC (REFLEX)

## 2020-07-21 LAB — LACTIC ACID, PLASMA: Lactic Acid, Venous: 1.1 mmol/L (ref 0.5–1.9)

## 2020-07-21 MED ORDER — POLYETHYLENE GLYCOL 3350 17 G PO PACK
17.0000 g | PACK | Freq: Every day | ORAL | Status: DC | PRN
  Administered 2020-07-22: 17 g via ORAL
  Filled 2020-07-21: qty 1

## 2020-07-21 MED ORDER — ACYCLOVIR SODIUM 50 MG/ML IV SOLN
INTRAVENOUS | Status: AC
  Filled 2020-07-21: qty 10

## 2020-07-21 MED ORDER — ACYCLOVIR SODIUM 50 MG/ML IV SOLN
INTRAVENOUS | Status: AC
  Administered 2020-07-21: 800 mg
  Filled 2020-07-21: qty 10

## 2020-07-21 MED ORDER — ENOXAPARIN SODIUM 40 MG/0.4ML ~~LOC~~ SOLN
40.0000 mg | SUBCUTANEOUS | Status: DC
  Administered 2020-07-21 – 2020-07-23 (×3): 40 mg via SUBCUTANEOUS
  Filled 2020-07-21 (×3): qty 0.4

## 2020-07-21 MED ORDER — DEXTROSE 5 % IV SOLN
10.0000 mg/kg | Freq: Three times a day (TID) | INTRAVENOUS | Status: DC
  Administered 2020-07-21 – 2020-07-24 (×9): 800 mg via INTRAVENOUS
  Filled 2020-07-21 (×10): qty 16

## 2020-07-21 MED ORDER — VANCOMYCIN HCL IN DEXTROSE 1-5 GM/200ML-% IV SOLN
1000.0000 mg | Freq: Two times a day (BID) | INTRAVENOUS | Status: DC
  Administered 2020-07-21 – 2020-07-24 (×7): 1000 mg via INTRAVENOUS
  Filled 2020-07-21 (×7): qty 200

## 2020-07-21 MED ORDER — ONDANSETRON HCL 4 MG PO TABS: 4.0000 mg | ORAL_TABLET | Freq: Four times a day (QID) | ORAL | Status: DC | PRN

## 2020-07-21 MED ORDER — OXYCODONE HCL 5 MG PO TABS: 5.0000 mg | ORAL_TABLET | ORAL | Status: DC | PRN

## 2020-07-21 MED ORDER — SODIUM CHLORIDE 0.9 % IV BOLUS
500.0000 mL | Freq: Once | INTRAVENOUS | Status: AC
  Administered 2020-07-21: 500 mL via INTRAVENOUS

## 2020-07-21 MED ORDER — SODIUM CHLORIDE 0.9% FLUSH
3.0000 mL | Freq: Two times a day (BID) | INTRAVENOUS | Status: DC
  Administered 2020-07-21 – 2020-07-23 (×4): 3 mL via INTRAVENOUS

## 2020-07-21 MED ORDER — SODIUM CHLORIDE 0.9 % IV SOLN: INTRAVENOUS | Status: DC | PRN

## 2020-07-21 MED ORDER — DEXTROSE 5 % IV SOLN
800.0000 mg | Freq: Once | INTRAVENOUS | Status: AC
  Administered 2020-07-21: 800 mg via INTRAVENOUS
  Filled 2020-07-21: qty 16

## 2020-07-21 MED ORDER — SODIUM CHLORIDE 0.9 % IV SOLN
2.0000 g | Freq: Two times a day (BID) | INTRAVENOUS | Status: DC
  Administered 2020-07-21 – 2020-07-24 (×7): 2 g via INTRAVENOUS
  Filled 2020-07-21: qty 2
  Filled 2020-07-21 (×2): qty 20
  Filled 2020-07-21 (×2): qty 2
  Filled 2020-07-21 (×2): qty 20

## 2020-07-21 MED ORDER — ACETAMINOPHEN 325 MG PO TABS
650.0000 mg | ORAL_TABLET | Freq: Four times a day (QID) | ORAL | Status: DC | PRN
  Administered 2020-07-23 – 2020-07-24 (×5): 650 mg via ORAL
  Filled 2020-07-21 (×5): qty 2

## 2020-07-21 MED ORDER — SODIUM CHLORIDE 0.9 % IV SOLN: Freq: Once | INTRAVENOUS | Status: AC

## 2020-07-21 MED ORDER — KETOROLAC TROMETHAMINE 15 MG/ML IJ SOLN
15.0000 mg | Freq: Four times a day (QID) | INTRAMUSCULAR | Status: DC | PRN
  Administered 2020-07-21 – 2020-07-22 (×4): 15 mg via INTRAVENOUS
  Filled 2020-07-21 (×5): qty 1

## 2020-07-21 MED ORDER — LACTATED RINGERS IV SOLN: INTRAVENOUS | Status: DC

## 2020-07-21 MED ORDER — ACETAMINOPHEN 650 MG RE SUPP: 650.0000 mg | Freq: Four times a day (QID) | RECTAL | Status: DC | PRN

## 2020-07-21 MED ORDER — ONDANSETRON HCL 4 MG/2ML IJ SOLN
4.0000 mg | Freq: Four times a day (QID) | INTRAMUSCULAR | Status: DC | PRN
  Administered 2020-07-21: 4 mg via INTRAVENOUS
  Filled 2020-07-21: qty 2

## 2020-07-21 MED ORDER — KETOROLAC TROMETHAMINE 30 MG/ML IJ SOLN
30.0000 mg | Freq: Once | INTRAMUSCULAR | Status: AC
  Administered 2020-07-21: 30 mg via INTRAVENOUS
  Filled 2020-07-21: qty 1

## 2020-07-21 NOTE — H&P (Addendum)
History and Physical    Caroline Mcgee FGH:[id number] DOB: Jun 30, 1975 DOA: 07/20/2020  PCP: System, Provider Not In   I have briefly reviewed patients previous medical reports in [facility].  Patient coming from: Home via [facility] ED  Chief Complaint: Headache, body aches, chills  HPI: Caroline Mcgee is a 45 year old female, separated from her spouse and ongoing divorce proceedings, lives with her parents and 69-year-old child, works in [employer] production pl[employer], independent, medical history significant for but not limited to general allergies, iron deficiency anemia, migraine and IBS presented to the ED on 07/20/2020 with above complaints.  3 days PTA, she was returning with her daughter from an overnight trip to [facility] by car when she started experiencing ankle pains.  Over the course of the next 12 to 24 hours, she developed generalized body aches, headache, neck pain, feeling hot and cold.  She reports headache is mostly frontal but also diffuse, severe, associated with neck pain, difficulty touching her chin to her anterior chest, associated with photophobia and had an episode of nonbloody emesis yesterday.  Did not check her temperature at home.  States that the headache is not similar to her usual migraines and this 1 is worse.  Denies stuffy nose, sore throat, earache, cough, or dyspnea.  Her 86-year-old daughter developed earache before her illness.  She denies insect bites, any other sick contacts, joint swelling or skin rash.  ED Course: Low-grade temperature of 99.6, transient mild tachycardia, normotensive and not hypoxic.  Lab work significant for WBC of 20.9, neutrophil 18.6, hemoglobin 11.3.  CK is normal, lactate normal.  Serum pregnancy test negative.  Flu and COVID-19 PCR negative.  HIV screen nonreactive.  Urine microscopy not indicative of UTI.  Blood cultures pending.  CT head without acute abnormality.  HSV PCR pending.  LP multiple attempts unsuccessful per EDP.   Empirically started on IV acyclovir, vancomycin and ceftriaxone.  IV fluids given.  Review of Systems:  All other systems reviewed and apart from HPI, are negative.  Past Medical History:  Diagnosis Date  . Allergies   . IBS (irritable bowel syndrome)   . Iron deficiency anemia   . Migraine     Past Surgical History:  Procedure Laterality Date  . ANKLE ARTHROSCOPY    . CHOLECYSTECTOMY    . CHOLECYSTECTOMY      Social History  reports that she has never smoked. She has never used smokeless tobacco. She reports that she does not drink alcohol and does not use drugs.  She reports occasionally drinking a glass of wine maybe once a month.  Allergies  Allergen Reactions  . Latex Hives and Rash    Family History  Problem Relation Age of Onset  . CAD Father   Mother has history of vertigo.   Prior to Admission medications   Medication Sig Start Date End Date Taking? Authorizing Provider  amoxicillin-clavulanate (AUGMENTIN) 875-125 MG tablet Take 1 tablet by mouth 2 (two) times daily. 05/06/19   Bethel Born, PA-C    Physical Exam: Vitals:   07/21/20 1200 07/21/20 1426 07/21/20 1616 07/21/20 1743  BP: (!) 144/87 121/60 (!) 138/92 (!) 142/86  Pulse: 94 (!) 104 (!) 104 97  Resp: 14 18 20  (!) 22  Temp: 99.6 F (37.6 C) 98.7 F (37.1 C) 98.8 F (37.1 C) 98.5 F (36.9 C)  TempSrc:   Oral Oral  SpO2: 98% 98% 100% 100%  Weight:      Height:  Constitutional: Young female, moderately built and obese lying uncomfortably supine in bed due to headache and photophobia.  Room is dark.  Has a towel covering her eyes. Eyes: Difficult exam due to patient cooperation from photophobia ENMT: Mucous membranes are dry. Posterior pharynx clear of any exudate or lesions. Normal dentition.  Neck: Stiffness +. Respiratory: Clear to auscultation without wheezing, rhonchi or crackles. No increased work of breathing. Cardiovascular: S1 & S2 heard, regular rate and rhythm. No  JVD, murmurs, rubs or clicks. No pedal edema. Abdomen: Non distended. Non tender. Soft. No organomegaly or masses appreciated. No clinical Ascites. Normal bowel sounds heard. Musculoskeletal: no clubbing / cyanosis. No joint deformity upper and lower extremities. Good ROM, no contractures. Normal muscle tone.  Skin: no rashes, lesions, ulcers in exposed parts of body. No induration Neurologic: CN 2-12 grossly intact. Sensation intact, DTR normal. Strength 5/5 in all 4 limbs.  Psychiatric: Normal judgment and insight. Alert and oriented x 3. Normal mood.     Labs on Admission: I have personally reviewed following labs and imaging studies  CBC: Recent Labs  Lab 07/20/20 2101  WBC 20.9*  NEUTROABS 18.6*  HGB 11.3*  HCT 35.6*  MCV 74.6*  PLT 556*    Basic Metabolic Panel: Recent Labs  Lab 07/20/20 2101  NA 136  K 3.6  CL 102  CO2 22  GLUCOSE 120*  BUN 11  CREATININE 1.00  CALCIUM 9.1    Liver Function Tests: Recent Labs  Lab 07/20/20 2101  AST 16  ALT 13  ALKPHOS 61  BILITOT 0.5  PROT 7.6  ALBUMIN 4.2    Urine analysis:    Component Value Date/Time   COLORURINE YELLOW 07/21/2020 0057   APPEARANCEUR CLEAR 07/21/2020 0057   LABSPEC 1.020 07/21/2020 0057   PHURINE 6.0 07/21/2020 0057   GLUCOSEU NEGATIVE 07/21/2020 0057   HGBUR TRACE (A) 07/21/2020 0057   BILIRUBINUR NEGATIVE 07/21/2020 0057   KETONESUR NEGATIVE 07/21/2020 0057   PROTEINUR NEGATIVE 07/21/2020 0057   NITRITE NEGATIVE 07/21/2020 0057   LEUKOCYTESUR NEGATIVE 07/21/2020 0057     Radiological Exams on Admission: CT Head Wo Contrast  Result Date: 07/20/2020 CLINICAL DATA:  Migraine headaches and fevers EXAM: CT HEAD WITHOUT CONTRAST TECHNIQUE: Contiguous axial images were obtained from the base of the skull through the vertex without intravenous contrast. COMPARISON:  None. FINDINGS: Brain: No evidence of acute infarction, hemorrhage, hydrocephalus, extra-axial collection or mass lesion/mass  effect. Vascular: No hyperdense vessel or unexpected calcification. Skull: Normal. Negative for fracture or focal lesion. Sinuses/Orbits: No acute finding. Other: None. IMPRESSION: No acute abnormality noted. Electronically Signed   By: Alcide Clever M.D.   On: 07/20/2020 23:30     Assessment/Plan Principal Problem:   Meningitis Active Problems:   Leukocytosis     Suspected acute meningitis  Patient presented with 2 to 3 days history of headache, photophobia, myalgia, low-grade fevers, neck pain.  Noted to have low-grade fevers, neck stiffness, neutrophilic leukocytosis  CT head negative.  EDP attempted LP and despite multiple attempts, each tap noted to be bloody and they were concerned about herpetic encephalitis.  Does not appear that the CSF sample was adequate for testing.  Follow HSV PCR and outstanding blood cultures.  Suspect viral meningitis but for now continue empirically started IV acyclovir, vancomycin and ceftriaxone.  Continue gentle IV fluids.  Pain management.  If does not improve or worsens, may need to consider repeat LP under fluoroscopy by IR.  Discussed with infectious disease MD on-call in  detail.  Recommends continuing current regimen, no current indication for steroids and patient will be seen tomorrow.  Other differential may be complex migraine but headache unlike her prior episodes of migraine and moreover would not explain the other symptoms such as diffuse body ache, low-grade fevers and leukocytosis.  Leukocytosis  Secondary to above.  Follow CBC.  Iron deficiency anemia  Hemoglobin appears to be at her baseline.  Body mass index is 34.97 kg/m./Obesity    DVT prophylaxis: Lovenox Code Status: Full Family Communication: None at bedside Disposition Plan:   Patient is from:  Home  Anticipated DC to:  Home  Anticipated DC date:  07/24/2020  Anticipated DC barriers: None   Consults called: Infectious Admission status: Inpatient, medical  bed, airborne/contact isolation  Severity of Illness: The appropriate patient status for this patient is INPATIENT. Inpatient status is judged to be reasonable and necessary in order to provide the required intensity of service to ensure the patient's safety. The patient's presenting symptoms, physical exam findings, and initial radiographic and laboratory data in the context of their chronic comorbidities is felt to place them at high risk for further clinical deterioration. Furthermore, it is not anticipated that the patient will be medically stable for discharge from the hospital within 2 midnights of admission. The following factors support the patient status of inpatient.   " The patient's presenting symptoms include headache, neck pain, photophobia, fever, chills and nausea. " The worrisome physical exam findings include low-grade fevers, photophobia, neck stiffness. " The initial radiographic and laboratory data are worrisome because of marked leukocytosis, negative CT head. " The chronic co-morbidities include obesity, IBS, migraine, iron deficiency anemia.   * I certify that at the point of admission it is my clinical judgment that the patient will require inpatient hospital care spanning beyond 2 midnights from the point of admission due to high intensity of service, high risk for further deterioration and high frequency of surveillance required.Marcellus Scott MD [facility]  To contact the attending provider between 7A-7P or the covering provider during after hours 7P-7A, please log into the web site [email or web address] and access using universal [place] password for that web site. If you do not have the password, please call the hospital operator.  07/21/2020, 7:03 PM

## 2020-07-21 NOTE — ED Notes (Signed)
Attempted report, RN unavailable.

## 2020-07-21 NOTE — Progress Notes (Signed)
Pharmacy Antibiotic Note  Caroline Mcgee is a 45 y.o. female admitted on 07/20/2020 with r/o meningitis. Pharmacy has been consulted for vancomycin and acyclovir dosing. Pt received first doses last night. Pt is afebrile and WBC is elevated at 20.9. SCr is WNL and lactic acid is <2.   Plan: Acyclovir 800mg  IV Q8H  Vancomycin 1gm IV Q12H  F/u renal fxn, C&S, clinical status and trough at [facility] De-escalate as able  Height: 5\' 8"  (172.7 cm) Weight: 104.3 kg (230 lb) IBW/kg (Calculated) : 63.9  Temp (24hrs), Avg:99.1 F (37.3 C), Min:98.5 F (36.9 C), Max:99.6 F (37.6 C)  Recent Labs  Lab 07/20/20 2101 07/20/20 2156  WBC 20.9*  --   CREATININE 1.00  --   LATICACIDVEN  --  1.2    Estimated Creatinine Clearance: 89.8 mL/min (by C-G formula based on SCr of 1 mg/dL).    Allergies  Allergen Reactions  . Latex Hives and Rash    Antimicrobials this admission: Vanc 3/10>> CTX 3/10>> Acyclovir 3/10>>  Dose adjustments this admission: N/A  Microbiology results: Pending  Thank you for allowing pharmacy to be a part of this patient's care.  Traci Plemons, 09/19/20 07/21/2020 8:18 AM

## 2020-07-22 DIAGNOSIS — A879 Viral meningitis, unspecified: Secondary | ICD-10-CM

## 2020-07-22 DIAGNOSIS — E876 Hypokalemia: Secondary | ICD-10-CM

## 2020-07-22 DIAGNOSIS — G039 Meningitis, unspecified: Secondary | ICD-10-CM

## 2020-07-22 DIAGNOSIS — R519 Headache, unspecified: Secondary | ICD-10-CM

## 2020-07-22 LAB — CBC
HCT: 32.1 % — ABNORMAL LOW (ref 36.0–46.0)
Hemoglobin: 9.6 g/dL — ABNORMAL LOW (ref 12.0–15.0)
MCH: 22.9 pg — ABNORMAL LOW (ref 26.0–34.0)
MCHC: 29.9 g/dL — ABNORMAL LOW (ref 30.0–36.0)
MCV: 76.4 fL — ABNORMAL LOW (ref 80.0–100.0)
Platelets: 474 10*3/uL — ABNORMAL HIGH (ref 150–400)
RBC: 4.2 MIL/uL (ref 3.87–5.11)
RDW: 18.3 % — ABNORMAL HIGH (ref 11.5–15.5)
WBC: 13.2 10*3/uL — ABNORMAL HIGH (ref 4.0–10.5)
nRBC: 0 % (ref 0.0–0.2)

## 2020-07-22 LAB — BASIC METABOLIC PANEL
Anion gap: 9 (ref 5–15)
BUN: 7 mg/dL (ref 6–20)
CO2: 26 mmol/L (ref 22–32)
Calcium: 8.6 mg/dL — ABNORMAL LOW (ref 8.9–10.3)
Chloride: 104 mmol/L (ref 98–111)
Creatinine, Ser: 0.9 mg/dL (ref 0.44–1.00)
GFR, Estimated: >60 mL/min (ref >60)
Glucose, Bld: 97 mg/dL (ref 70–99)
Potassium: 3.4 mmol/L — ABNORMAL LOW (ref 3.5–5.1)
Sodium: 139 mmol/L (ref 135–145)

## 2020-07-22 LAB — HEPATITIS C ANTIBODY: HCV Ab: NONREACTIVE

## 2020-07-22 MED ORDER — POTASSIUM CHLORIDE CRYS ER 20 MEQ PO TBCR
40.0000 meq | EXTENDED_RELEASE_TABLET | Freq: Once | ORAL | Status: AC
  Administered 2020-07-22: 40 meq via ORAL
  Filled 2020-07-22: qty 2

## 2020-07-22 NOTE — Progress Notes (Signed)
PROGRESS NOTE   Caroline Mcgee  GGE:[id number]    DOB: [birth year]    DOA: 07/20/2020  PCP: System, Provider Not In   I have briefly reviewed patients previous medical records in [facility]  Chief Complaint  Patient presents with  . Generalized Body Aches    Brief Narrative:  45 year old female with medical history significant for and not limited to allergies, iron deficiency anemia, migraine, IBS and PCOS presented with 3 days history of headache, neck pain, generalized body aches, feeling hot and cold, photophobia, seen at [facility] ED where CT head negative, significant neutrophilic leukocytosis, unsuccessful LP, empirically started on IV acyclovir/vancomycin/ceftriaxone for suspected meningitis and transferred to [facility].  Improving.  ID consulted   Assessment & Plan:  Principal Problem:   Meningitis Active Problems:   Leukocytosis   Suspected acute meningitis  Patient presented with 2 to 3 days history of headache, photophobia, myalgia, low-grade fevers, neck pain.  Noted in the ED to have low-grade fevers, neck stiffness, neutrophilic leukocytosis  CT head negative.  EDP attempted LP and despite multiple attempts, each tap noted to be bloody and they were concerned about herpetic encephalitis.  Does not appear that the CSF sample was adequate for testing.  Follow HSV PCR.  Blood cultures x2: Negative to date.  Suspect viral meningitis but for now continue empirically started IV acyclovir, vancomycin and ceftriaxone.    If does not improve or worsens, may need to consider repeat LP under fluoroscopy by IR.  Consulted ID and discussed with infectious disease MD on-call in detail on evening of admission who recommended continuing current regimen of IV acyclovir, vancomycin and ceftriaxone and no steroids recommended.  Checking Lyme, HSV serology and RPR.  Other differential may be complex migraine but headache unlike her prior episodes of migraine and moreover would not explain  the other symptoms such as diffuse body ache, low-grade fevers and leukocytosis.  Clinically improved.  Hypokalemia:  Replace and follow.  Leukocytosis  Secondary to above.  Improving.  Continue to follow CBC.  Iron deficiency anemia  Hemoglobin down from 11.3-9.6.  Baseline is probably in the 11 g range.  The drop could be due to hemodilution versus acute illness.  No bleeding reported.  Follow CBC in a.m.  Body mass index is 34.97 kg/m.   DVT prophylaxis: enoxaparin (LOVENOX) injection 40 mg Start: 07/21/20 2000     Code Status: Full Code Family Communication:  Disposition:  Status is: Inpatient  Remains inpatient appropriate because:Inpatient level of care appropriate due to severity of illness   Dispo: The patient is from: Home              Anticipated d/c is to: Home              Patient currently is not medically stable to d/c.   Difficult to place patient No        Consultants:   Infectious disease  Procedures:   Unsuccessful LP attempts at [facility] ED by EDP.  Antimicrobials:    Anti-infectives (From admission, onward)   Start     Dose/Rate Route Frequency Ordered Stop   07/21/20 1600  acyclovir (ZOVIRAX) 800 mg in dextrose 5 % 250 mL IVPB        800 mg 266 mL/hr over 60 Minutes Intravenous  Once 07/21/20 1550 07/21/20 1715   07/21/20 1551  acyclovir (ZOVIRAX) 50 MG/ML injection       Note to Pharmacy: Oren Binet   : cabinet override  07/21/20 1551 07/22/20 0359   07/21/20 1200  vancomycin (VANCOCIN) IVPB 1000 mg/200 mL premix        1,000 mg 200 mL/hr over 60 Minutes Intravenous Every 12 hours 07/21/20 0817     07/21/20 1000  cefTRIAXone (ROCEPHIN) 2 g in sodium chloride 0.9 % 100 mL IVPB        2 g 200 mL/hr over 30 Minutes Intravenous Every 12 hours 07/21/20 0809     07/21/20 0839  acyclovir (ZOVIRAX) 50 MG/ML injection       Note to Pharmacy: Ellender Hose  : cabinet override      07/21/20 0839 07/21/20 0847   07/21/20 0830   acyclovir (ZOVIRAX) 800 mg in dextrose 5 % 150 mL IVPB        10 mg/kg  80.1 kg (Adjusted) 166 mL/hr over 60 Minutes Intravenous Every 8 hours 07/21/20 0809     07/20/20 2334  acyclovir (ZOVIRAX) 50 MG/ML injection       Note to Pharmacy: Gentry Fitz   : cabinet override      07/20/20 2334 07/20/20 2351   07/20/20 2330  acyclovir (ZOVIRAX) 800 mg in dextrose 5 % 250 mL IVPB        800 mg 266 mL/hr over 60 Minutes Intravenous  Once 07/20/20 2303 07/21/20 0050   07/20/20 2330  vancomycin (VANCOCIN) IVPB 1000 mg/200 mL premix        1,000 mg 200 mL/hr over 60 Minutes Intravenous Every 1 hr x 2 07/20/20 2310 07/21/20 0300   07/20/20 2145  cefTRIAXone (ROCEPHIN) 2 g in sodium chloride 0.9 % 100 mL IVPB        2 g 200 mL/hr over 30 Minutes Intravenous  Once 07/20/20 2136 07/20/20 2240        Subjective:  Feels much better.  Diffuse body aches have resolved.  Now only with headache, neck pain and upper back pain, better, rates at 4/10 in severity.  Photophobia much improved.  No nausea or vomiting.  Tolerated dinner and breakfast this morning well without complaints.  Objective:   Vitals:   07/21/20 1743 07/21/20 2219 07/22/20 0431 07/22/20 1057  BP: (!) 142/86 136/84 (!) 158/80 (!) 142/70  Pulse: 97 (!) 106 99 97  Resp: (!) 22 16 16 15   Temp: 98.5 F (36.9 C) 98.7 F (37.1 C) 98.4 F (36.9 C) 98.4 F (36.9 C)  TempSrc: Oral Oral Oral Oral  SpO2: 100% 97% 93% 96%  Weight:      Height:        General exam: Young female, moderately built and obese lying comfortably propped up in bed.  Looks much improved compared to last evening.  No photophobia. Neck: Mild stiffness, feels improved compared to yesterday Respiratory system: Clear to auscultation.  No increased work of breathing. Cardiovascular system: S1 & S2 heard, RRR. No JVD, murmurs, rubs, gallops or clicks. No pedal edema. Gastrointestinal system: Abdomen is nondistended, soft and nontender. No organomegaly or masses felt.  Normal bowel sounds heard. Central nervous system: Alert and oriented. No focal neurological deficits. Extremities: Symmetric 5 x 5 power. Skin: No rashes, lesions or ulcers Psychiatry: Judgement and insight appear normal. Mood & affect appropriate.     Data Reviewed:   I have personally reviewed following labs and imaging studies   CBC: Recent Labs  Lab 07/20/20 2101 07/22/20 0347  WBC 20.9* 13.2*  NEUTROABS 18.6*  --   HGB 11.3* 9.6*  HCT 35.6* 32.1*  MCV 74.6* 76.4*  PLT  556* 474*    Basic Metabolic Panel: Recent Labs  Lab 07/20/20 2101 07/22/20 0347  NA 136 139  K 3.6 3.4*  CL 102 104  CO2 22 26  GLUCOSE 120* 97  BUN 11 7  CREATININE 1.00 0.90  CALCIUM 9.1 8.6*    Liver Function Tests: Recent Labs  Lab 07/20/20 2101  AST 16  ALT 13  ALKPHOS 61  BILITOT 0.5  PROT 7.6  ALBUMIN 4.2    CBG: No results for input(s): GLUCAP in the last 168 hours.  Microbiology Studies:   Recent Results (from the past 240 hour(s))  Resp Panel by RT-PCR (Flu A&B, Covid) Nasopharyngeal Swab     Status: None   Collection Time: 07/20/20  9:01 PM   Specimen: Nasopharyngeal Swab; Nasopharyngeal(NP) swabs in vial transport medium  Result Value Ref Range Status   SARS Coronavirus 2 by RT PCR NEGATIVE NEGATIVE Final    Comment: (NOTE) SARS-CoV-2 target nucleic acids are NOT DETECTED.  The SARS-CoV-2 RNA is generally detectable in upper respiratory specimens during the acute phase of infection. The lowest concentration of SARS-CoV-2 viral copies this assay can detect is 138 copies/mL. A negative result does not preclude SARS-Cov-2 infection and should not be used as the sole basis for treatment or other patient management decisions. A negative result may occur with  improper specimen collection/handling, submission of specimen other than nasopharyngeal swab, presence of viral mutation(s) within the areas targeted by this assay, and inadequate number of viral copies(<138  copies/mL). A negative result must be combined with clinical observations, patient history, and epidemiological information. The expected result is Negative.  Fact Sheet for Patients:  BloggerCourse.comhttps://www.fda.gov/media/152166/download  Fact Sheet for Healthcare Providers:  SeriousBroker.ithttps://www.fda.gov/media/152162/download  This test is no t yet approved or cleared by the [place] FDA and  has been authorized for detection and/or diagnosis of SARS-CoV-2 by FDA under an Emergency Use Authorization (EUA). This EUA will remain  in effect (meaning this test can be used) for the duration of the COVID-19 declaration under Section 564(b)(1) of the Act, 21 U.S.C.section 360bbb-3(b)(1), unless the authorization is terminated  or revoked sooner.       Influenza A by PCR NEGATIVE NEGATIVE Final   Influenza B by PCR NEGATIVE NEGATIVE Final    Comment: (NOTE) The Xpert Xpress SARS-CoV-2/FLU/RSV plus assay is intended as an aid in the diagnosis of influenza from Nasopharyngeal swab specimens and should not be used as a sole basis for treatment. Nasal washings and aspirates are unacceptable for Xpert Xpress SARS-CoV-2/FLU/RSV testing.  Fact Sheet for Patients: BloggerCourse.comhttps://www.fda.gov/media/152166/download  Fact Sheet for Healthcare Providers: SeriousBroker.ithttps://www.fda.gov/media/152162/download  This test is not yet approved or cleared by the [place] FDA and has been authorized for detection and/or diagnosis of SARS-CoV-2 by FDA under an Emergency Use Authorization (EUA). This EUA will remain in effect (meaning this test can be used) for the duration of the COVID-19 declaration under Section 564(b)(1) of the Act, 21 U.S.C. section 360bbb-3(b)(1), unless the authorization is terminated or revoked.  Performed at [facility], [address] [place], [place] [address]   Blood culture (routine x 2)     Status: None (Preliminary result)   Collection Time: 07/20/20  9:56 PM   Specimen: BLOOD RIGHT  HAND  Result Value Ref Range Status   Specimen Description   Final    BLOOD RIGHT HAND Performed at [facility] [place], [address] [place], [place] [address]    Special Requests  Final    BOTTLES DRAWN AEROBIC AND ANAEROBIC Blood Culture adequate volume Performed at [facility], [address] [place], [place] [address]    Culture   Final    NO GROWTH < 12 HOURS Performed at [facility], [address], [place], [place] [address]    Report Status PENDING  Incomplete  Blood culture (routine x 2)     Status: None (Preliminary result)   Collection Time: 07/20/20 10:00 PM   Specimen: BLOOD  Result Value Ref Range Status   Specimen Description   Final    BLOOD LEFT ANTECUBITAL Performed at [facility], [address] [place], [place] [address]    Special Requests   Final    BOTTLES DRAWN AEROBIC AND ANAEROBIC Blood Culture adequate volume Performed at [facility], [address] [place], [place] [address]    Culture   Final    NO GROWTH < 12 HOURS Performed at [facility], [address], [place], [place] [address]    Report Status PENDING  Incomplete     Radiology Studies:  CT Head Wo Contrast  Result Date: 07/20/2020 CLINICAL DATA:  Migraine headaches and fevers EXAM: CT HEAD WITHOUT CONTRAST TECHNIQUE: Contiguous axial images were obtained from the base of the skull through the vertex without intravenous contrast. COMPARISON:  None. FINDINGS: Brain: No evidence of acute infarction, hemorrhage, hydrocephalus, extra-axial collection or mass lesion/mass effect. Vascular: No hyperdense vessel or unexpected calcification. Skull: Normal. Negative for fracture or focal lesion. Sinuses/Orbits: No acute finding. Other: None. IMPRESSION: No acute abnormality noted. Electronically Signed   By: Alcide Clever M.D.   On: 07/20/2020 23:30     Scheduled Meds:   . enoxaparin (LOVENOX) injection  40 mg Subcutaneous Q24H  . sodium  chloride flush  3 mL Intravenous Q12H    Continuous Infusions:   . sodium chloride 1,000 mL (07/21/20 2250)  . acyclovir 800 mg (07/22/20 0858)  . cefTRIAXone (ROCEPHIN)  IV 2 g (07/22/20 1052)  . vancomycin 1,000 mg (07/22/20 1225)     LOS: 1 day     Marcellus Scott, MD, [place], [facility]. [facility]    To contact the attending provider between 7A-7P or the covering provider during after hours 7P-7A, please log into the web site [email or web address] and access using universal [place] password for that web site. If you do not have the password, please call the hospital operator.  07/22/2020, 2:04 PM

## 2020-07-22 NOTE — Consult Note (Addendum)
[facility]                                                                                        Patient Identification: Patient Name: Caroline Mcgee MRN: [id number] Admit Date: 07/20/2020  7:52 PM Today's Date: 07/22/2020 Reason for consult: Meningitis  Requesting provider: Marcellus Scott   Principal Problem:   Meningitis Active Problems:   Leukocytosis   Antibiotics: ceftriaxone 3/10-                    Vancomycin 3/10-                    Acyclovir 3/10-  Lines/Tubes: PIVs  Assessment 45 Y O relatively healthy female with h/o complex migrane who presented with 2-3 days acute onset of headache/generalised bodyache/neck pain/photophobia a/w nausea and vomiting.   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 She says she has been vaccinated to chicken pox, measles, mumps as a child.   Afebrile here but significant leukocytosis on presentation which is downtrending on IV antibiotics.   ? Meningitis- Most likely viral. R/o HSV HIV NR Influenza A and B negative COVID Negative   No known immunocompromising conditions or drugs   Recommendations  Continue Vancomycin, ceftriaxone for now Steroids not recommended yesterday as ideally it is given with or just before 1st dose of antibiotics Continue acyclovir for now Fu HSV PCR  Lyme serology , RPR, HSV serology ? LP by IR. Patient is willing to undergo LP by IR Following   Rest of the management as per the primary team. Please call with questions or concerns.  Thank you for the consult  __________________________________________________________________________________________________________ HPI and Hospital Course: 55 Y O Female with no significant past medical history who came to the ED 2 days ago with complaints of headache, diffuse myalgia and vomiting.   States that she was well until Tuesday while returning back from her trip to [place] last weekend.  On Wednesday, she started having pain in her bilateral ankles which was followed by subjective fevers, headaches and generalized body aches including neck pain/stiffness, back pain and pain in her extremities.  She also had nausea.  He had vomiting on Thursday.  She also had photophobia.  Denies having blurry vision, ear pain, discharge from ear.  Denies any abdominal pain or diarrhea, denies any cough or shortness of breath and chest pain.  Her 60-year-old daughter was seen at her doctor's office couple of days ago for a ear infection.  She took some antibiotics which helped her to some extent.  Denies having similar headache/photophobia prior but has a history of complex migraine in the past.  She does not think her current headache is similar to complex migraine.  Denies any rashes, joint swelling or tenderness.  Denies any mosquito or tick bite recently. Denies any oral sores/ulcers or dysphagia   She was born in [place] and moved to [place] in 1990s after which she moved again to [place], [place], [place] and finally moved back to  [place] 2 years ago.  She is married and is currently going through a divorce with her husband who lives in [place].  She has a 46-year-old daughter.  She states her husband works with school-aged children at [employer] and per her report he is very unhygienic.  She denies being sexually active with anyone for the last 2 years.  She states she has a history of genital herpes almost 20 years ago and thinks she has taken Valtrex.  She did not have any problem after that.  She denies smoking alcohol and using drugs.  She does not have any pets at home.  At ED she was afebrile but had leukocytosis of 20.9 with some left shift.  She feels somewhat better since she has been in the hospital with IV antibiotics and acyclovir.  Her appetite is also getting  better.  ROS: General- Denies fever, chills, loss of appetite and loss of weight HEENT - Denies  blurry vision, sinus pain Chest - Denies any chest pain, SOB or cough CVS- Denies any dizziness/lightheadedness, syncopal attacks, palpitations Abdomen- Denies any nausea, vomiting, abdominal pain, hematochezia and diarrhea Neuro - Denies any weakness, numbness, tingling sensation Psych - Denies any changes in mood irritability or depressive symptoms GU- Denies any burning, dysuria, hematuria or increased frequency of urination Skin - denies any rashes/lesions MSK - denies any joint pain/swelling or restricted ROM   Past Medical History:  Diagnosis Date  . Allergies   . IBS (irritable bowel syndrome)   . Iron deficiency anemia   . Migraine    Past Surgical History:  Procedure Laterality Date  . ANKLE ARTHROSCOPY    . CHOLECYSTECTOMY    . CHOLECYSTECTOMY       Scheduled Meds: . enoxaparin (LOVENOX) injection  40 mg Subcutaneous Q24H  . sodium chloride flush  3 mL Intravenous Q12H   Continuous Infusions: . sodium chloride 1,000 mL (07/21/20 2250)  . acyclovir 800 mg (07/22/20 0039)  . cefTRIAXone (ROCEPHIN)  IV 2 g (07/21/20 2252)  . lactated ringers 75 mL/hr at 07/22/20 0230  . vancomycin 1,000 mg (07/21/20 2333)   PRN Meds:.sodium chloride, acetaminophen **OR** acetaminophen, ketorolac, ondansetron **OR** ondansetron (ZOFRAN) IV, oxyCODONE, polyethylene glycol  Allergies  Allergen Reactions  . Latex Hives and Rash   Social History   Socioeconomic History  . Marital status: Single    Spouse name: Not on file  . Number of children: Not on file  . Years of education: Not on file  . Highest education level: Not on file  Occupational History  . Not on file  Tobacco Use  . Smoking status: Never Smoker  . Smokeless tobacco: Never Used  Vaping Use  . Vaping Use: Never used  Substance and Sexual Activity  . Alcohol use: Never  . Drug use: Never  . Sexual activity:  Not Currently  Other Topics Concern  . Not on file  Social History Narrative  . Not on file   Social Determinants of Health   Financial Resource Strain: Not on file  Food Insecurity: Not on file  Transportation Needs: Not on file  Physical Activity: Not on file  Stress: Not on file  Social Connections: Not on file  Intimate Partner Violence: Not on file    Vitals BP (!) 158/80 (BP Location: Left Arm)   Pulse 99   Temp 98.4 F (36.9 C) (Oral)   Resp 16   Ht 5\' 8"  (1.727 m)   Wt 104.3 kg   LMP 07/10/2020  SpO2 93%   BMI 34.97 kg/m    Physical Exam She is sitting up in the bed in a dark room because of photophobia and requests not to turn on the light No pallor, icterus, PERRLA, no oral ulcers  Chest clear air sounds bilaterally CVS normal heart heart sounds, no murmur Abdomen soft nontender nondistended Extremities no joint swelling or tenderness Skin no rashes petechiae or purpura Neuro cranial nerves II to XII intact, sensory and motor within normal limit  Pertinent Microbiology Results for orders placed or performed during the hospital encounter of 07/20/20  Resp Panel by RT-PCR (Flu A&B, Covid) Nasopharyngeal Swab     Status: None   Collection Time: 07/20/20  9:01 PM   Specimen: Nasopharyngeal Swab; Nasopharyngeal(NP) swabs in vial transport medium  Result Value Ref Range Status   SARS Coronavirus 2 by RT PCR NEGATIVE NEGATIVE Final    Comment: (NOTE) SARS-CoV-2 target nucleic acids are NOT DETECTED.  The SARS-CoV-2 RNA is generally detectable in upper respiratory specimens during the acute phase of infection. The lowest concentration of SARS-CoV-2 viral copies this assay can detect is 138 copies/mL. A negative result does not preclude SARS-Cov-2 infection and should not be used as the sole basis for treatment or other patient management decisions. A negative result may occur with  improper specimen collection/handling, submission of specimen other than  nasopharyngeal swab, presence of viral mutation(s) within the areas targeted by this assay, and inadequate number of viral copies(<138 copies/mL). A negative result must be combined with clinical observations, patient history, and epidemiological information. The expected result is Negative.  Fact Sheet for Patients:  BloggerCourse.com  Fact Sheet for Healthcare Providers:  SeriousBroker.it  This test is no t yet approved or cleared by the [place] FDA and  has been authorized for detection and/or diagnosis of SARS-CoV-2 by FDA under an Emergency Use Authorization (EUA). This EUA will remain  in effect (meaning this test can be used) for the duration of the COVID-19 declaration under Section 564(b)(1) of the Act, 21 U.S.C.section 360bbb-3(b)(1), unless the authorization is terminated  or revoked sooner.       Influenza A by PCR NEGATIVE NEGATIVE Final   Influenza B by PCR NEGATIVE NEGATIVE Final    Comment: (NOTE) The Xpert Xpress SARS-CoV-2/FLU/RSV plus assay is intended as an aid in the diagnosis of influenza from Nasopharyngeal swab specimens and should not be used as a sole basis for treatment. Nasal washings and aspirates are unacceptable for Xpert Xpress SARS-CoV-2/FLU/RSV testing.  Fact Sheet for Patients: BloggerCourse.com  Fact Sheet for Healthcare Providers: SeriousBroker.it  This test is not yet approved or cleared by the [place] FDA and has been authorized for detection and/or diagnosis of SARS-CoV-2 by FDA under an Emergency Use Authorization (EUA). This EUA will remain in effect (meaning this test can be used) for the duration of the COVID-19 declaration under Section 564(b)(1) of the Act, 21 U.S.C. section 360bbb-3(b)(1), unless the authorization is terminated or revoked.  Performed at [facility], [address] [place], [place]  [address]   Blood culture (routine x 2)     Status: None (Preliminary result)   Collection Time: 07/20/20  9:56 PM   Specimen: BLOOD RIGHT HAND  Result Value Ref Range Status   Specimen Description   Final    BLOOD RIGHT HAND Performed at [facility], [address] [place], [place] [address]    Special Requests   Final    BOTTLES DRAWN  AEROBIC AND ANAEROBIC Blood Culture adequate volume Performed at [facility], [address] [place], [place] [address]    Culture   Final    NO GROWTH < 12 HOURS Performed at [facility], [address] [place], [place] [address]    Report Status PENDING  Incomplete  Blood culture (routine x 2)     Status: None (Preliminary result)   Collection Time: 07/20/20 10:00 PM   Specimen: BLOOD  Result Value Ref Range Status   Specimen Description   Final    BLOOD LEFT ANTECUBITAL Performed at [facility], [address] [place], [place] [address]    Special Requests   Final    BOTTLES DRAWN AEROBIC AND ANAEROBIC Blood Culture adequate volume Performed at [facility], [address] [place], [place] [address]    Culture   Final    NO GROWTH < 12 HOURS Performed at [facility], [address] [place], [place] [address]    Report Status PENDING  Incomplete     Pertinent Lab seen by me: CBC Latest Ref Rng & Units 07/22/2020 07/20/2020 01/20/2018  WBC 4.0 - 10.5 K/uL 13.2(H) 20.9(H) -  Hemoglobin 12.0 - 15.0 g/dL 2.6(V) 11.3(L) 12.6  Hematocrit 36.0 - 46.0 % 32.1(L) 35.6(L) 37.0  Platelets 150 - 400 K/uL 474(H) 556(H) -   CMP Latest Ref Rng & Units 07/22/2020 07/20/2020 01/20/2018  Glucose 70 - 99 mg/dL 97 785(Y) 90  BUN 6 - 20 mg/dL 7 11 7   Creatinine 0.44 - 1.00 mg/dL 8.50 2.77)  Sodium 135 - 145 mmol/L 139 136 140  Potassium 3.5 - 5.1 mmol/L 3.4(L) 3.6 3.4(L)  Chloride 98 - 111 mmol/L 104 102 103  CO2 22 - 32 mmol/L 26 22 -  Calcium 8.9 - 10.3 mg/dL 4.12(I) 9.1 -  Total  Protein 6.5 - 8.1 g/dL - 7.6 -  Total Bilirubin 0.3 - 1.2 mg/dL - 0.5 -  Alkaline Phos 38 - 126 U/L - 61 -  AST 15 - 41 U/L - 16 -  ALT 0 - 44 U/L - 13 -    Pertinent Imagings/Other Imagings Plain films and CT images have been personally visualized and interpreted; radiology reports have been reviewed. Decision making incorporated into the Impression / Recommendations.  CT head 07/20/20 FINDINGS: Brain: No evidence of acute infarction, hemorrhage, hydrocephalus, extra-axial collection or mass lesion/mass effect.  Vascular: No hyperdense vessel or unexpected calcification.  Skull: Normal. Negative for fracture or focal lesion.  Sinuses/Orbits: No acute finding.  Other: None.  IMPRESSION: No acute abnormality noted.  I have spent 60 minutes for this patient encounter including review of prior medical records with greater than 50% of time being face to face and coordination of their care.  Electronically signed by:   09/19/20, MD Infectious Disease Physician [facility] Pager: [phone]

## 2020-07-23 LAB — CBC
HCT: 34.1 % — ABNORMAL LOW (ref 36.0–46.0)
Hemoglobin: 10.5 g/dL — ABNORMAL LOW (ref 12.0–15.0)
MCH: 23.5 pg — ABNORMAL LOW (ref 26.0–34.0)
MCHC: 30.8 g/dL (ref 30.0–36.0)
MCV: 76.5 fL — ABNORMAL LOW (ref 80.0–100.0)
Platelets: 486 10*3/uL — ABNORMAL HIGH (ref 150–400)
RBC: 4.46 MIL/uL (ref 3.87–5.11)
RDW: 18.4 % — ABNORMAL HIGH (ref 11.5–15.5)
WBC: 13.1 10*3/uL — ABNORMAL HIGH (ref 4.0–10.5)
nRBC: 0 % (ref 0.0–0.2)

## 2020-07-23 LAB — BASIC METABOLIC PANEL
Anion gap: 11 (ref 5–15)
BUN: 8 mg/dL (ref 6–20)
CO2: 24 mmol/L (ref 22–32)
Calcium: 8.8 mg/dL — ABNORMAL LOW (ref 8.9–10.3)
Chloride: 102 mmol/L (ref 98–111)
Creatinine, Ser: 0.9 mg/dL (ref 0.44–1.00)
GFR, Estimated: >60 mL/min (ref >60)
Glucose, Bld: 99 mg/dL (ref 70–99)
Potassium: 3.6 mmol/L (ref 3.5–5.1)
Sodium: 137 mmol/L (ref 135–145)

## 2020-07-23 LAB — RPR: RPR Ser Ql: NONREACTIVE

## 2020-07-23 NOTE — Plan of Care (Signed)

## 2020-07-23 NOTE — Progress Notes (Signed)
PROGRESS NOTE   Caroline Mcgee  ZOX:[id number]    DOB: Jul 05, 1975    DOA: 07/20/2020  PCP: System, Provider Not In   I have briefly reviewed patients previous medical records in [facility].  Chief Complaint  Patient presents with  . Generalized Body Aches    Brief Narrative:  45 year old female with medical history significant for and not limited to allergies, iron deficiency anemia, migraine, IBS and PCOS presented with 3 days history of headache, neck pain, generalized body aches, feeling hot and cold, photophobia, seen at [facility] ED where CT head negative, significant neutrophilic leukocytosis, unsuccessful LP, empirically started on IV acyclovir/vancomycin/ceftriaxone for suspected meningitis and transferred to [facility].  Improving.  ID consulted   Assessment & Plan:  Principal Problem:   Meningitis Active Problems:   Leukocytosis   Suspected acute viral meningitis  Patient presented with 2 to 3 days history of headache, photophobia, myalgia, low-grade fevers, neck pain.  Noted in the ED to have low-grade fevers, neck stiffness, neutrophilic leukocytosis  CT head negative.  EDP attempted LP and despite multiple attempts, each tap noted to be bloody and they were concerned about herpetic encephalitis.  Does not appear that the CSF sample was adequate for testing.  Follow HSV PCR.  Blood cultures x2: Negative to date.  If does not improve or worsens, may need to consider repeat LP under fluoroscopy by IR.  Consulted ID and discussed with infectious disease MD on-call in detail on evening of admission who recommended continuing current regimen of IV acyclovir, vancomycin and ceftriaxone and no steroids recommended.  Checking Lyme, HSV serology and RPR.  Other differential may be complex migraine but headache unlike her prior episodes of migraine and moreover would not explain the other symptoms such as diffuse body ache, low-grade fevers and leukocytosis.  ID input appreciated,  suspect viral meningitis but for now continue empirically started IV acyclovir, vancomycin and ceftriaxone.    Clinically continues to improve  HCV antibody, RPR: Nonreactive.  Rest of above test results pending  Hypokalemia:  Replaced  Leukocytosis  Secondary to above.  Improving.  Continue to follow CBC.  Iron deficiency anemia  Hemoglobin down from 11.3-9.6.  Baseline is probably in the 11 g range.  The drop could be due to hemodilution versus acute illness.  No bleeding reported.  Stable  Body mass index is 34.97 kg/m.   DVT prophylaxis: enoxaparin (LOVENOX) injection 40 mg Start: 07/21/20 2000     Code Status: Full Code Family Communication:  Disposition:  Status is: Inpatient  Remains inpatient appropriate because:Inpatient level of care appropriate due to severity of illness   Dispo: The patient is from: Home              Anticipated d/c is to: Home              Patient currently is not medically stable to d/c.   Difficult to place patient No        Consultants:   Infectious disease  Procedures:   Unsuccessful LP attempts at [facility] ED by EDP.  Antimicrobials:    Anti-infectives (From admission, onward)   Start     Dose/Rate Route Frequency Ordered Stop   07/21/20 1600  acyclovir (ZOVIRAX) 800 mg in dextrose 5 % 250 mL IVPB        800 mg 266 mL/hr over 60 Minutes Intravenous  Once 07/21/20 1550 07/21/20 1715   07/21/20 1551  acyclovir (ZOVIRAX) 50 MG/ML injection  Note to Pharmacy: Oren Binet   : cabinet override      07/21/20 1551 07/22/20 0359   07/21/20 1200  vancomycin (VANCOCIN) IVPB 1000 mg/200 mL premix        1,000 mg 200 mL/hr over 60 Minutes Intravenous Every 12 hours 07/21/20 0817     07/21/20 1000  cefTRIAXone (ROCEPHIN) 2 g in sodium chloride 0.9 % 100 mL IVPB        2 g 200 mL/hr over 30 Minutes Intravenous Every 12 hours 07/21/20 0809     07/21/20 0839  acyclovir (ZOVIRAX) 50 MG/ML injection       Note to Pharmacy:  Ellender Hose  : cabinet override      07/21/20 0839 07/21/20 0847   07/21/20 0830  acyclovir (ZOVIRAX) 800 mg in dextrose 5 % 150 mL IVPB        10 mg/kg  80.1 kg (Adjusted) 166 mL/hr over 60 Minutes Intravenous Every 8 hours 07/21/20 0809     07/20/20 2334  acyclovir (ZOVIRAX) 50 MG/ML injection       Note to Pharmacy: Gentry Fitz   : cabinet override      07/20/20 2334 07/20/20 2351   07/20/20 2330  acyclovir (ZOVIRAX) 800 mg in dextrose 5 % 250 mL IVPB        800 mg 266 mL/hr over 60 Minutes Intravenous  Once 07/20/20 2303 07/21/20 0050   07/20/20 2330  vancomycin (VANCOCIN) IVPB 1000 mg/200 mL premix        1,000 mg 200 mL/hr over 60 Minutes Intravenous Every 1 hr x 2 07/20/20 2310 07/21/20 0300   07/20/20 2145  cefTRIAXone (ROCEPHIN) 2 g in sodium chloride 0.9 % 100 mL IVPB        2 g 200 mL/hr over 30 Minutes Intravenous  Once 07/20/20 2136 07/20/20 2240        Subjective:  Continues to feel better.  Wants to know when she can go home.  Still has mild headache, neck pain rated at 2/10 in severity.  Some pain at LP site.  No other complaints reported.  States that she had a court date in [facility] upcoming Tuesday 3/15.  Objective:   Vitals:   07/22/20 2034 07/23/20 0122 07/23/20 0533 07/23/20 1301  BP: (!) 177/96 129/77 132/71 134/88  Pulse: 97 90 88 88  Resp: 17 20 14 20   Temp: 98.5 F (36.9 C) 97.8 F (36.6 C) 98.4 F (36.9 C) 98.3 F (36.8 C)  TempSrc: Oral Oral Oral Oral  SpO2: 99% 95% 98% 96%  Weight:      Height:        General exam: Young female, moderately built and obese lying comfortably propped up in bed.  Appears to be in good spirits.  Significantly improved compared to 48 hours ago. Neck: Minimal stiffness but improved to prior Respiratory system: Clear to auscultation.  No increased work of breathing. Cardiovascular system: S1 & S2 heard, RRR. No JVD, murmurs, rubs, gallops or clicks. No pedal edema. Gastrointestinal system: Abdomen is  nondistended, soft and nontender. No organomegaly or masses felt. Normal bowel sounds heard. Central nervous system: Alert and oriented. No focal neurological deficits. Extremities: Symmetric 5 x 5 power. Skin: No rashes, lesions or ulcers Psychiatry: Judgement and insight appear normal. Mood & affect appropriate.     Data Reviewed:   I have personally reviewed following labs and imaging studies   CBC: Recent Labs  Lab 07/20/20 2101 07/22/20 0347 07/23/20 0523  WBC 20.9* 13.2*  13.1*  NEUTROABS 18.6*  --   --   HGB 11.3* 9.6* 10.5*  HCT 35.6* 32.1* 34.1*  MCV 74.6* 76.4* 76.5*  PLT 556* 474* 486*    Basic Metabolic Panel: Recent Labs  Lab 07/20/20 2101 07/22/20 0347 07/23/20 0523  NA 136 139 137  K 3.6 3.4* 3.6  CL 102 104 102  CO2 22 26 24   GLUCOSE 120* 97 99  BUN 11 7 8   CREATININE 1.00 0.90 0.90  CALCIUM 9.1 8.6* 8.8*    Liver Function Tests: Recent Labs  Lab 07/20/20 2101  AST 16  ALT 13  ALKPHOS 61  BILITOT 0.5  PROT 7.6  ALBUMIN 4.2    CBG: No results for input(s): GLUCAP in the last 168 hours.  Microbiology Studies:   Recent Results (from the past 240 hour(s))  Resp Panel by RT-PCR (Flu A&B, Covid) Nasopharyngeal Swab     Status: None   Collection Time: 07/20/20  9:01 PM   Specimen: Nasopharyngeal Swab; Nasopharyngeal(NP) swabs in vial transport medium  Result Value Ref Range Status   SARS Coronavirus 2 by RT PCR NEGATIVE NEGATIVE Final    Comment: (NOTE) SARS-CoV-2 target nucleic acids are NOT DETECTED.  The SARS-CoV-2 RNA is generally detectable in upper respiratory specimens during the acute phase of infection. The lowest concentration of SARS-CoV-2 viral copies this assay can detect is 138 copies/mL. A negative result does not preclude SARS-Cov-2 infection and should not be used as the sole basis for treatment or other patient management decisions. A negative result may occur with  improper specimen collection/handling, submission of  specimen other than nasopharyngeal swab, presence of viral mutation(s) within the areas targeted by this assay, and inadequate number of viral copies(<138 copies/mL). A negative result must be combined with clinical observations, patient history, and epidemiological information. The expected result is Negative.  Fact Sheet for Patients:  09/19/20  Fact Sheet for Healthcare Providers:  09/19/20  This test is no t yet approved or cleared by the [employer] FDA and  has been authorized for detection and/or diagnosis of SARS-CoV-2 by FDA under an Emergency Use Authorization (EUA). This EUA will remain  in effect (meaning this test can be used) for the duration of the COVID-19 declaration under Section 564(b)(1) of the Act, 21 U.S.C.section 360bbb-3(b)(1), unless the authorization is terminated  or revoked sooner.       Influenza A by PCR NEGATIVE NEGATIVE Final   Influenza B by PCR NEGATIVE NEGATIVE Final    Comment: (NOTE) The Xpert Xpress SARS-CoV-2/FLU/RSV plus assay is intended as an aid in the diagnosis of influenza from Nasopharyngeal swab specimens and should not be used as a sole basis for treatment. Nasal washings and aspirates are unacceptable for Xpert Xpress SARS-CoV-2/FLU/RSV testing.  Fact Sheet for Patients: SeriousBroker.it  Fact Sheet for Healthcare Providers: [place]  This test is not yet approved or cleared by the [employer] FDA and has been authorized for detection and/or diagnosis of SARS-CoV-2 by FDA under an Emergency Use Authorization (EUA). This EUA will remain in effect (meaning this test can be used) for the duration of the COVID-19 declaration under Section 564(b)(1) of the Act, 21 U.S.C. section 360bbb-3(b)(1), unless the authorization is terminated or revoked.  Performed at [facility], [address] [place], [address] [place]   Blood culture (routine x 2)     Status: None (Preliminary result)   Collection Time: 07/20/20  9:56 PM   Specimen: BLOOD RIGHT HAND  Result  Value Ref Range Status   Specimen Description   Final    BLOOD RIGHT HAND Performed at [facility] [place], [address] [place], [place] [address]    Special Requests   Final    BOTTLES DRAWN AEROBIC AND ANAEROBIC Blood Culture adequate volume Performed at [facility] [place], [address] [place], [place] [address]    Culture   Final    NO GROWTH 1 DAY Performed at [facility], [address] [place], [place] [address]    Report Status PENDING  Incomplete  Blood culture (routine x 2)     Status: None (Preliminary result)   Collection Time: 07/20/20 10:00 PM   Specimen: BLOOD  Result Value Ref Range Status   Specimen Description   Final    BLOOD LEFT ANTECUBITAL Performed at [facility] [place], [address] [place], [place] [address]    Special Requests   Final    BOTTLES DRAWN AEROBIC AND ANAEROBIC Blood Culture adequate volume Performed at [facility] [place], [address] [place], [place] [address]    Culture   Final    NO GROWTH 1 DAY Performed at [facility], [address] [place], [place] [address]    Report Status PENDING  Incomplete     Radiology Studies:  No results found.   Scheduled Meds:   . enoxaparin (LOVENOX) injection  40 mg Subcutaneous Q24H  . sodium chloride flush  3 mL Intravenous Q12H    Continuous Infusions:   . sodium chloride 1,000 mL (07/21/20 2250)  . acyclovir 800 mg (07/23/20 0934)  . cefTRIAXone (ROCEPHIN)  IV 2 g (07/23/20 1125)  . vancomycin 1,000 mg (07/23/20 1259)     LOS: 2 days     Marcellus ScottAnand Halea Lieb, MD, [place]FACP, [facility]. [facility]    To contact the attending provider between 7A-7P or the covering provider during after hours 7P-7A, please log into the web site [email or web address] and access using universal [facility] password for that web site. If you do not have the password, please call the hospital operator.  07/23/2020, 3:36 PM

## 2020-07-24 DIAGNOSIS — M7989 Other specified soft tissue disorders: Secondary | ICD-10-CM

## 2020-07-24 DIAGNOSIS — G039 Meningitis, unspecified: Secondary | ICD-10-CM | POA: Diagnosis not present

## 2020-07-24 DIAGNOSIS — T3695XA Adverse effect of unspecified systemic antibiotic, initial encounter: Secondary | ICD-10-CM | POA: Diagnosis not present

## 2020-07-24 LAB — CBC
HCT: 33.9 % — ABNORMAL LOW (ref 36.0–46.0)
Hemoglobin: 10.5 g/dL — ABNORMAL LOW (ref 12.0–15.0)
MCH: 23.4 pg — ABNORMAL LOW (ref 26.0–34.0)
MCHC: 31 g/dL (ref 30.0–36.0)
MCV: 75.7 fL — ABNORMAL LOW (ref 80.0–100.0)
Platelets: 460 10*3/uL — ABNORMAL HIGH (ref 150–400)
RBC: 4.48 MIL/uL (ref 3.87–5.11)
RDW: 18.3 % — ABNORMAL HIGH (ref 11.5–15.5)
WBC: 13.4 10*3/uL — ABNORMAL HIGH (ref 4.0–10.5)
nRBC: 0 % (ref 0.0–0.2)

## 2020-07-24 LAB — HSV DNA BY PCR (REFERENCE LAB)
HSV 1 DNA: NEGATIVE
HSV 2 DNA: NEGATIVE

## 2020-07-24 LAB — B. BURGDORFI ANTIBODIES: B burgdorferi Ab IgG+IgM: <0.91 {ISR} (ref 0.00–0.90)

## 2020-07-24 LAB — HSV 2 ANTIBODY, IGG: HSV 2 Glycoprotein G Ab, IgG: 7.58 index — ABNORMAL HIGH (ref 0.00–0.90)

## 2020-07-24 LAB — HSV 1 ANTIBODY, IGG: HSV 1 Glycoprotein G Ab, IgG: <0.91 index (ref 0.00–0.90)

## 2020-07-24 NOTE — Progress Notes (Signed)
    [facility]    Date of Admission:  07/20/2020   Total days of antibiotics 5           ID: Caroline Mcgee is a 45 y.o. female with  Principal Problem:   Meningitis Active Problems:   Leukocytosis    Subjective: She is feeling much better since admission. Denies fever, no longer having headache, nor photophobia. She reports that her 9 yo daughter is being treated for ear infection. Patient states that she noticed swelling to PIV site about her wrist and had abtx infusion stopped due to concern for allergy. Once infusion stopped, did not have any further reaction. No shortness of breath, nor swelling of throat, no hypotension.  Medications:  . enoxaparin (LOVENOX) injection  40 mg Subcutaneous Q24H  . sodium chloride flush  3 mL Intravenous Q12H    Objective: Vital signs in last 24 hours: Temp:  [97.9 F (36.6 C)-98.2 F (36.8 C)] 98.2 F (36.8 C) (03/14 1200) Pulse Rate:  [90-108] 102 (03/14 1200) Resp:  [17-20] 20 (03/14 1200) BP: (130-160)/(73-119) 160/119 (03/14 1200) SpO2:  [95 %-98 %] 98 % (03/14 1200) Gen= a x o by 3 in NAD HEENT= MMM, PERRLA, no nuchal rigidity Skin = mild induration near piv site of wrist  Lab Results Recent Labs    07/22/20 0347 07/23/20 0523 07/24/20 0709  WBC 13.2* 13.1* 13.4*  HGB 9.6* 10.5* 10.5*  HCT 32.1* 34.1* 33.9*  NA 139 137  --   K 3.4* 3.6  --   CL 104 102  --   CO2 26 24  --   BUN 7 8  --   CREATININE 0.90 0.90  --     Microbiology: reviewed Studies/Results: No results found.   Assessment/Plan: Presumed aseptic meningitis = also has family sick contact. Labs reveal quick recovery since admission. Recommend to finish course today and monitor off of treatment. Follow up with PCP. If recurrence of symptoms then would pursue, IR guided LP, which was not pursued earlier in the hospitalization.  abtx reaction = unclear if it was true abtx allergy vs malfunction with piv that has now improved.  If has rash development, recommend benadryl.  [facility] Infect[facility] Cell: [phone] Pager: [phone]  07/24/2020, 3:30pm

## 2020-07-24 NOTE — Progress Notes (Signed)
Pt. Discharged via wheelchair accompanied by staff. Pt. Is alert and oriented, not on any distress. Discharge instruction and education discussed with pt. All personal belongings are with the patient.

## 2020-07-24 NOTE — Discharge Summary (Signed)
Physician Discharge Summary  [name] NID:[id number] DOB: 1975/11/13  PCP: Dr. Laurence Spates with [facility] in [place], [place]  Admitted from: Home Discharged to: Home  Admit date: 07/20/2020 Discharge date: 07/24/2020  Recommendations for Outpatient Follow-up:    Follow-up Information    Dr. Tommas Olp Doctor. Schedule an appointment as soon as possible for a visit.   Why: To be seen in next 4 to 5 days with repeat labs (CBC & BMP). Contact information: House, [place]               Home Health: None    Equipment/Devices: None    Discharge Condition: Improved and stable   Code Status: Full Code Diet recommendation:  Discharge Diet Orders (From admission, onward)    Start     Ordered   07/24/20 0000  Diet - low sodium heart healthy        07/24/20 0953           Discharge Diagnoses:  Principal Problem:   Meningitis Active Problems:   Leukocytosis   Brief Summary: 45 year old female with medical history significant for and not limited to allergies, iron deficiency anemia, migraine, IBS and PCOS presented with 3 days history of headache, neck pain, generalized body aches, feeling hot and cold, photophobia, seen at [facility] ED where CT head negative, significant neutrophilic leukocytosis, unsuccessful LP, empirically started on IV acyclovir/vancomycin/ceftriaxone for suspected meningitis and transferred to [facility].  ID was consulted.  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  3 days PTA, she was returning with her daughter from an  overnight trip to [facility] by car when she started experiencing ankle pains.  Over the course of the next 12 to 24 hours, she developed generalized body aches, headache, neck pain, feeling hot and cold.  She reports headache is mostly frontal but also diffuse, severe, associated with neck pain, difficulty touching her chin to her anterior chest, associated with photophobia and had an episode of nonbloody emesis yesterday.  Did not check her temperature at home.  States that the headache is not similar to her usual migraines and this 1 is worse.  Denies stuffy nose, sore throat, earache, cough, or dyspnea.  Her 22-year-old daughter developed earache before her illness.  She denies insect bites, any other sick contacts, joint swelling or skin rash.  ED Course: Low-grade temperature of 99.6, transient mild tachycardia, normotensive and not hypoxic.  Lab work significant for WBC of 20.9, neutrophil 18.6, hemoglobin 11.3.  CK is normal, lactate normal.  Serum pregnancy test negative.  Flu and COVID-19 PCR negative.  HIV screen nonreactive.  Urine microscopy not indicative of UTI.  Blood cultures pending.  CT head without acute abnormality.  HSV PCR pending.  LP multiple attempts unsuccessful per EDP.  Empirically started on IV acyclovir, vancomycin and ceftriaxone.  IV fluids given."   Assessment & Plan:   Suspected acute viral meningitis  Patient presented with 2 to 3 days history of headache, photophobia, myalgia, low-grade fevers, neck pain. Noted in the ED to have low-grade fevers, neck stiffness, neutrophilic leukocytosis  CT head negative.  EDP attempted LP and despite  multiple attempts, each tap noted to be bloody and they were concerned about herpetic encephalitis. Does not appear that the CSF sample was adequate for testing.  HSV 1 and 2 DNA by PCR: Negative.  PR: Nonreactive.  Borrelia burgdorferi IgG and IgM antibodies: Negative.  HCV antibodies: Nonreactive.  HIV screen: Nonreactive. HSV 1 and 2  IgG antibodies pending and to be followed up outpatient by PCP.  Blood cultures x2: Negative to date.  ID was consulted, suspected viral meningitis but continued on IV acyclovir, vancomycin and ceftriaxone and no steroids recommended.    Other differential may be complex migraine but headache unlike her prior episodes of migraine and moreover would not explain the other symptoms such as diffuse body ache, low-grade fevers and leukocytosis.  Clinically improved.  ID followed up today and cleared for discharge on no antiviral or antibiotics and recommended OP follow-up with PCP.  Hypokalemia:  Replaced  Leukocytosis  Secondary to above.  Improved and stable.  Iron deficiency anemia  Hemoglobin down from 11.3-9.6.  Baseline is probably in the 11 g range.  The drop could be due to hemodilution versus acute illness.  No bleeding reported.  Stable  Body mass index is 34.97 kg/m./Obesity    Consultants:   Infectious disease  Procedures:   Unsuccessful LP attempts at [facility] ED by EDP.   Discharge Instructions  Discharge Instructions    Call MD for:  difficulty breathing, headache or visual disturbances   Complete by: As directed    Call MD for:  extreme fatigue   Complete by: As directed    Call MD for:  persistant dizziness or light-headedness   Complete by: As directed    Call MD for:  persistant nausea and vomiting   Complete by: As directed    Call MD for:  severe uncontrolled pain   Complete by: As directed    Call MD for:  temperature >100.4   Complete by: As directed    Diet - low sodium heart healthy   Complete by: As directed    Increase activity slowly   Complete by: As directed        Medication List    TAKE these medications   IRON PO Take 1 tablet by mouth daily.   Multivitamin Adult Tabs Take 1 tablet by mouth daily.   vitamin C 500 MG tablet Commonly known as: ASCORBIC ACID Take 500 mg by mouth daily.      Allergies  Allergen  Reactions  . Latex Hives and Rash      Procedures/Studies: CT Head Wo Contrast  Result Date: 07/20/2020 CLINICAL DATA:  Migraine headaches and fevers EXAM: CT HEAD WITHOUT CONTRAST TECHNIQUE: Contiguous axial images were obtained from the base of the skull through the vertex without intravenous contrast. COMPARISON:  None. FINDINGS: Brain: No evidence of acute infarction, hemorrhage, hydrocephalus, extra-axial collection or mass lesion/mass effect. Vascular: No hyperdense vessel or unexpected calcification. Skull: Normal. Negative for fracture or focal lesion. Sinuses/Orbits: No acute finding. Other: None. IMPRESSION: No acute abnormality noted. Electronically Signed   By: Alcide Clever M.D.   On: 07/20/2020 23:30      Subjective: Patient seen early this morning.  Insists on being discharged.  Reports that she feels great.  Minimal headache/intermittent 2/10 in severity, minimal soreness at LP site but otherwise has no complaints.  Advised her regarding DC after ID clearance.  Seen by ID this afternoon.  Noted solitary BP elevation, unclear significance, unclear if accurate, had consistent controlled BPs  prior to that  Discharge Exam:  Vitals:   07/23/20 1301 07/23/20 2032 07/24/20 0631 07/24/20 1200  BP: 134/88 130/74 130/73 (!) 160/119  Pulse: 88 (!) 108 90 (!) 102  Resp: 20 20 17 20   Temp: 98.3 F (36.8 C) 97.9 F (36.6 C) 98.1 F (36.7 C) 98.2 F (36.8 C)  TempSrc: Oral Oral Oral Oral  SpO2: 96% 95% 95% 98%  Weight:      Height:         General exam: Young female, moderately built and obese lying comfortably propped up in bed.  Neck:  No neck stiffness noted today. Respiratory system: Clear to auscultation.  No increased work of breathing. Cardiovascular system: S1 & S2 heard, RRR. No JVD, murmurs, rubs, gallops or clicks. No pedal edema. Gastrointestinal system: Abdomen is nondistended, soft and nontender. No organomegaly or masses felt. Normal bowel sounds  heard. Central nervous system: Alert and oriented. No focal neurological deficits. Extremities: Symmetric 5 x 5 power. Skin: No rashes, lesions or ulcers Psychiatry: Judgement and insight appear normal. Mood & affect appropriate.    The results of significant diagnostics from this hospitalization (including imaging, microbiology, ancillary and laboratory) are listed below for reference.     Microbiology: Recent Results (from the past 240 hour(s))  Resp Panel by RT-PCR (Flu A&B, Covid) Nasopharyngeal Swab     Status: None   Collection Time: 07/20/20  9:01 PM   Specimen: Nasopharyngeal Swab; Nasopharyngeal(NP) swabs in vial transport medium  Result Value Ref Range Status   SARS Coronavirus 2 by RT PCR NEGATIVE NEGATIVE Final    Comment: (NOTE) SARS-CoV-2 target nucleic acids are NOT DETECTED.  The SARS-CoV-2 RNA is generally detectable in upper respiratory specimens during the acute phase of infection. The lowest concentration of SARS-CoV-2 viral copies this assay can detect is 138 copies/mL. A negative result does not preclude SARS-Cov-2 infection and should not be used as the sole basis for treatment or other patient management decisions. A negative result may occur with  improper specimen collection/handling, submission of specimen other than nasopharyngeal swab, presence of viral mutation(s) within the areas targeted by this assay, and inadequate number of viral copies(<138 copies/mL). A negative result must be combined with clinical observations, patient history, and epidemiological information. The expected result is Negative.  Fact Sheet for Patients:  09/19/20  Fact Sheet for Healthcare Providers:  BloggerCourse.com  This test is no t yet approved or cleared by the [place] FDA and  has been authorized for detection and/or diagnosis of SARS-CoV-2 by FDA under an Emergency Use Authorization (EUA). This EUA  will remain  in effect (meaning this test can be used) for the duration of the COVID-19 declaration under Section 564(b)(1) of the Act, 21 U.S.C.section 360bbb-3(b)(1), unless the authorization is terminated  or revoked sooner.       Influenza A by PCR NEGATIVE NEGATIVE Final   Influenza B by PCR NEGATIVE NEGATIVE Final    Comment: (NOTE) The Xpert Xpress SARS-CoV-2/FLU/RSV plus assay is intended as an aid in the diagnosis of influenza from Nasopharyngeal swab specimens and should not be used as a sole basis for treatment. Nasal washings and aspirates are unacceptable for Xpert Xpress SARS-CoV-2/FLU/RSV testing.  Fact Sheet for Patients: [place]  Fact Sheet for Healthcare Providers: BloggerCourse.com  This test is not yet approved or cleared by the [employer] FDA and has been authorized for detection and/or diagnosis of SARS-CoV-2 by FDA under an Emergency Use Authorization (EUA). This EUA will remain in effect (  meaning this test can be used) for the duration of the COVID-19 declaration under Section 564(b)(1) of the Act, 21 U.S.C. section 360bbb-3(b)(1), unless the authorization is terminated or revoked.  Performed at [facility] [place], [address] [place], [place] [address]   Blood culture (routine x 2)     Status: None (Preliminary result)   Collection Time: 07/20/20  9:56 PM   Specimen: BLOOD RIGHT HAND  Result Value Ref Range Status   Specimen Description   Final    BLOOD RIGHT HAND Performed at [facility] [place], [address] [place], [place] [address]    Special Requests   Final    BOTTLES DRAWN AEROBIC AND ANAEROBIC Blood Culture adequate volume Performed at [facility] [place], [address] [place], [place] [address]    Culture   Final    NO GROWTH 3 DAYS Performed at [facility], [address] [place], [place] [address]    Report Status PENDING  Incomplete   Blood culture (routine x 2)     Status: None (Preliminary result)   Collection Time: 07/20/20 10:00 PM   Specimen: BLOOD  Result Value Ref Range Status   Specimen Description   Final    BLOOD LEFT ANTECUBITAL Performed at [facility] [place], [address] [place], [place] [address]    Special Requests   Final    BOTTLES DRAWN AEROBIC AND ANAEROBIC Blood Culture adequate volume Performed at [facility] [place], [address] [place], [place] [address]    Culture   Final    NO GROWTH 3 DAYS Performed at [facility], [address] [place], [place] [address]    Report Status PENDING  Incomplete     Labs: CBC: Recent Labs  Lab 07/20/20 2101 07/22/20 0347 07/23/20 0523 07/24/20 0709  WBC 20.9* 13.2* 13.1* 13.4*  NEUTROABS 18.6*  --   --   --   HGB 11.3* 9.6* 10.5* 10.5*  HCT 35.6* 32.1* 34.1* 33.9*  MCV 74.6* 76.4* 76.5* 75.7*  PLT 556* 474* 486* 460*    Basic Metabolic Panel: Recent Labs  Lab 07/20/20 2101 07/22/20 0347 07/23/20 0523  NA 136 139 137  K 3.6 3.4* 3.6  CL 102 104 102  CO2 22 26 24   GLUCOSE 120* 97 99  BUN 11 7 8   CREATININE 1.00 0.90 0.90  CALCIUM 9.1 8.6* 8.8*    Liver Function Tests: Recent Labs  Lab 07/20/20 2101  AST 16  ALT 13  ALKPHOS 61  BILITOT 0.5  PROT 7.6  ALBUMIN 4.2    Urinalysis    Component Value Date/Time   COLORURINE YELLOW 07/21/2020 0057   APPEARANCEUR CLEAR 07/21/2020 0057   LABSPEC 1.020 07/21/2020 0057   PHURINE 6.0 07/21/2020 0057   GLUCOSEU NEGATIVE 07/21/2020 0057   HGBUR TRACE (A) 07/21/2020 0057   BILIRUBINUR NEGATIVE 07/21/2020 0057   KETONESUR NEGATIVE 07/21/2020 0057   PROTEINUR NEGATIVE 07/21/2020 0057   NITRITE NEGATIVE 07/21/2020 0057   LEUKOCYTESUR NEGATIVE 07/21/2020 0057      Time coordinating discharge: 25 minutes  SIGNED:  Marcellus ScottAnand Gennell How, MD, FACP, [facility]. [facility]  To contact the attending provider between 7A-7P or the covering provider during after  hours 7P-7A, please log into the web site [email or web address] and access using universal [place] password for that web site. If you do not have the password, please call the hospital operator.

## 2020-07-24 NOTE — Plan of Care (Signed)

## 2020-07-24 NOTE — Discharge Instructions (Signed)

## 2020-07-26 LAB — CULTURE, BLOOD (ROUTINE X 2)
Culture: NO GROWTH
Culture: NO GROWTH
Special Requests: ADEQUATE
Special Requests: ADEQUATE

## 2022-02-14 IMAGING — CT CT HEAD W/O CM
3 series · 14 of 47 positions shown, 16 images · non-contrast
Comparison: None.

CLINICAL DATA: Migraine headaches and fevers

EXAM:
CT HEAD WITHOUT CONTRAST
TECHNIQUE: Contiguous axial images were obtained from the base of the skull
through the vertex without intravenous contrast.

[Series 2: head wo · axial · 0.44mm/px · z∈[-195,-60]mm · 8 of 33 slices shown, 10 images]
[im 3/33  brain]
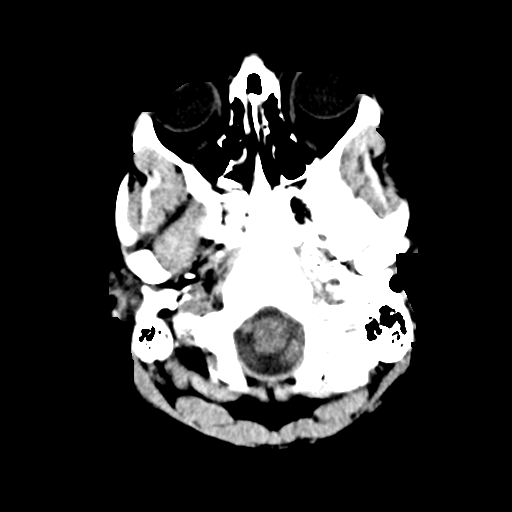
[im 3/33  bone]
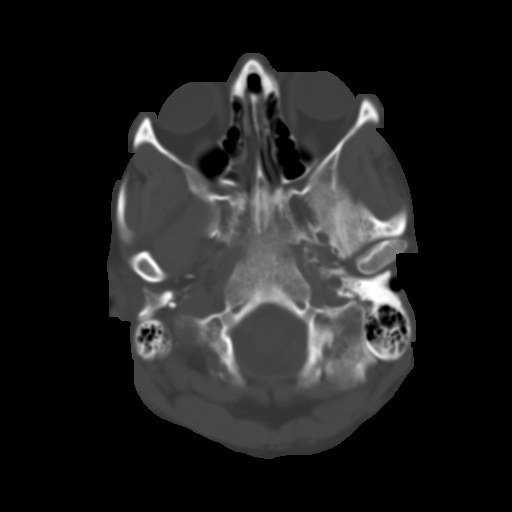
[im 7/33  brain]
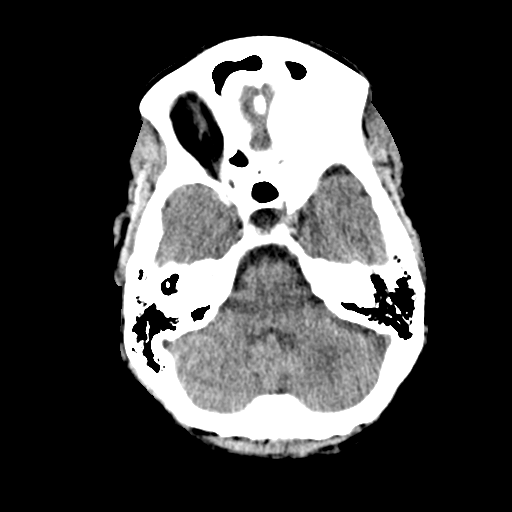
[im 10/33  brain]
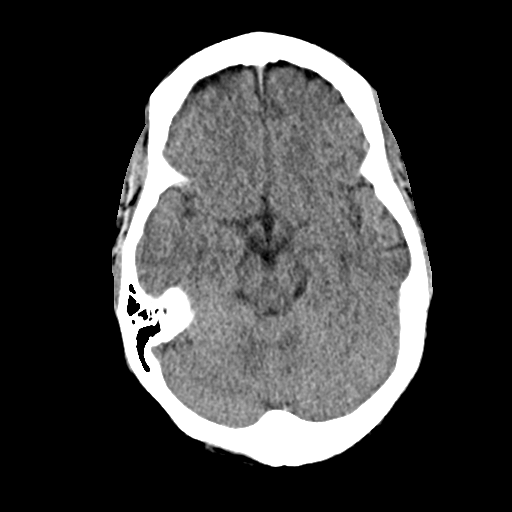
[im 15/33  brain]
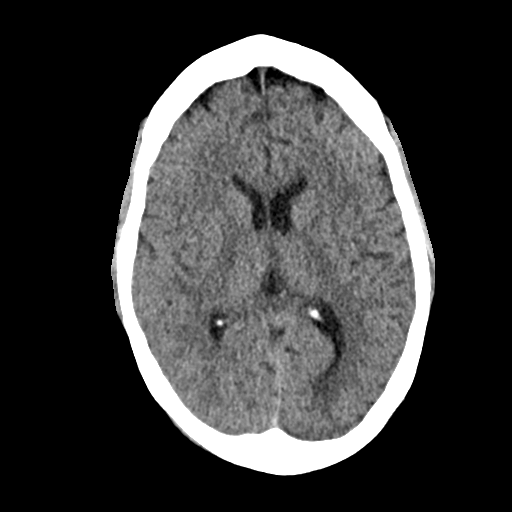
[im 18/33  brain]
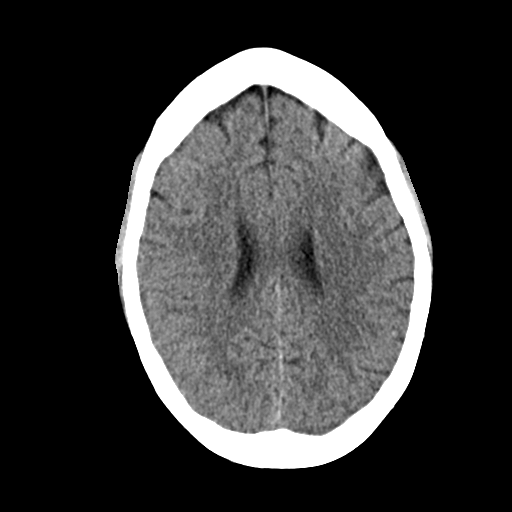
[im 18/33  bone]
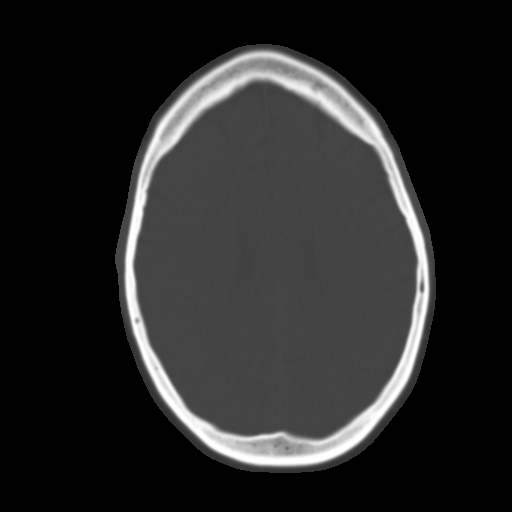
[im 23/33  brain]
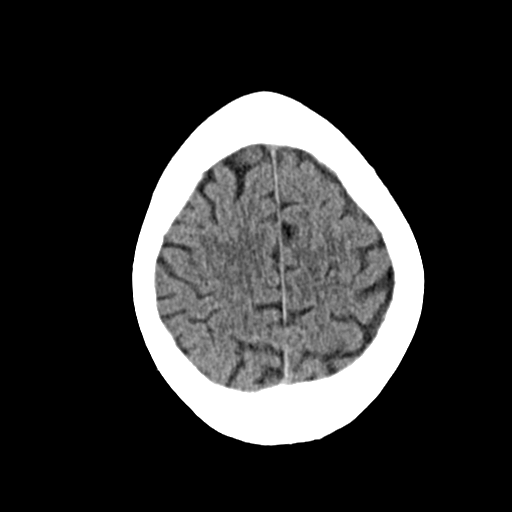
[im 26/33  brain]
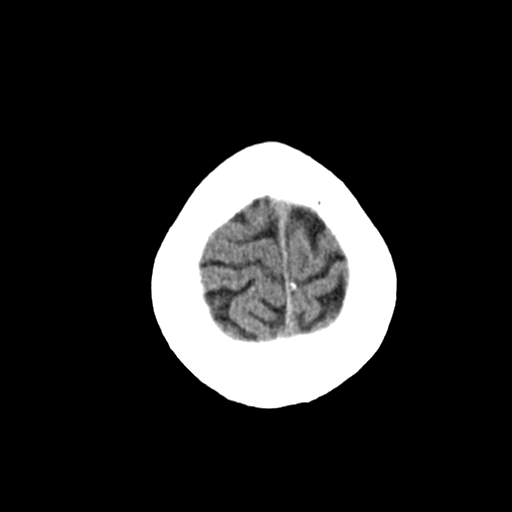
[im 30/33  brain]
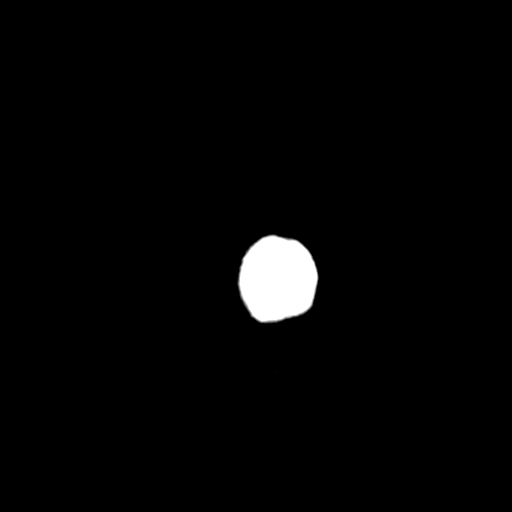

[Series 4: coronal soft · coronal · 0.31mm/px · 3 of 69 slices shown]
[im 23/69  brain]
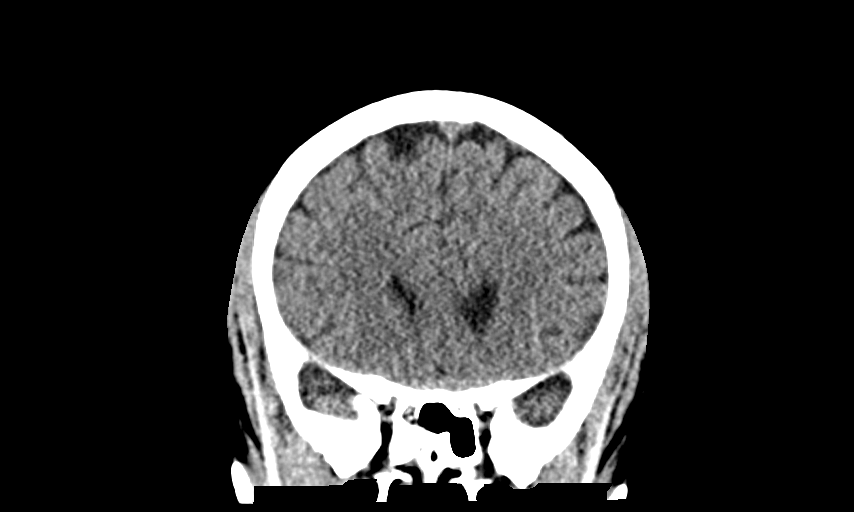
[im 31/69  brain]
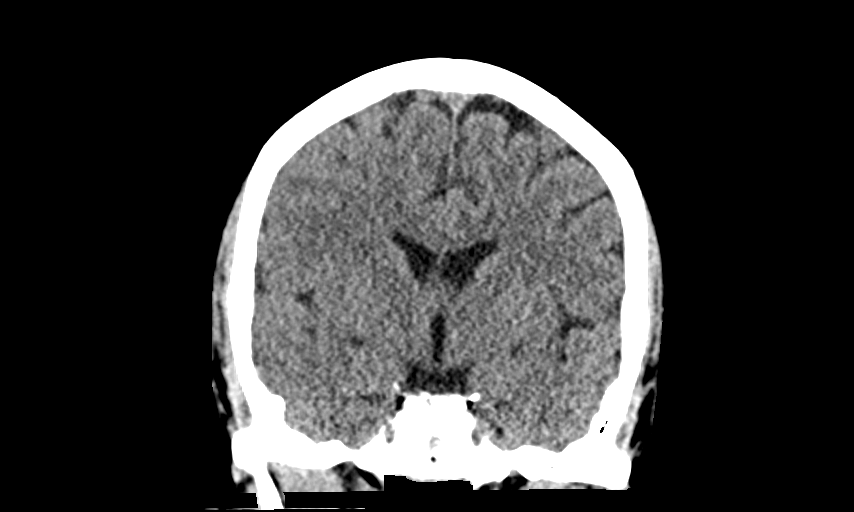
[im 38/69  brain]
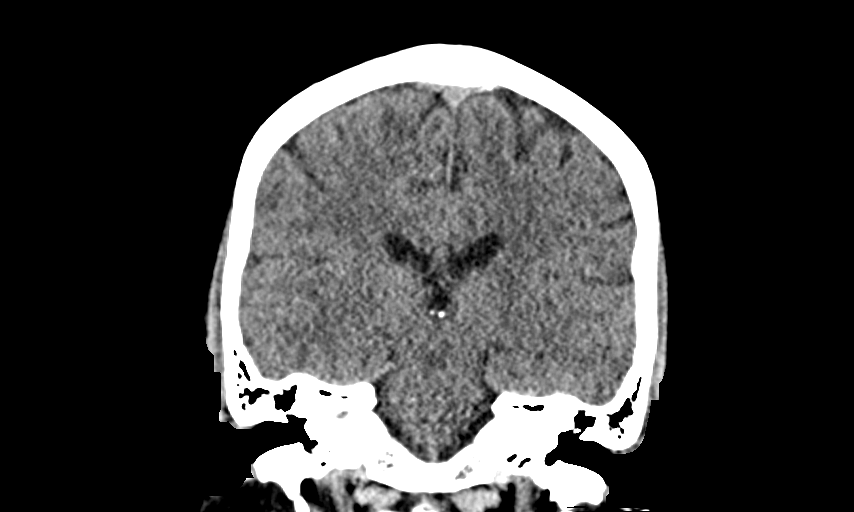

[Series 5: sag soft · sagittal · 0.31mm/px · 3 of 67 slices shown]
[im 23/67  brain]
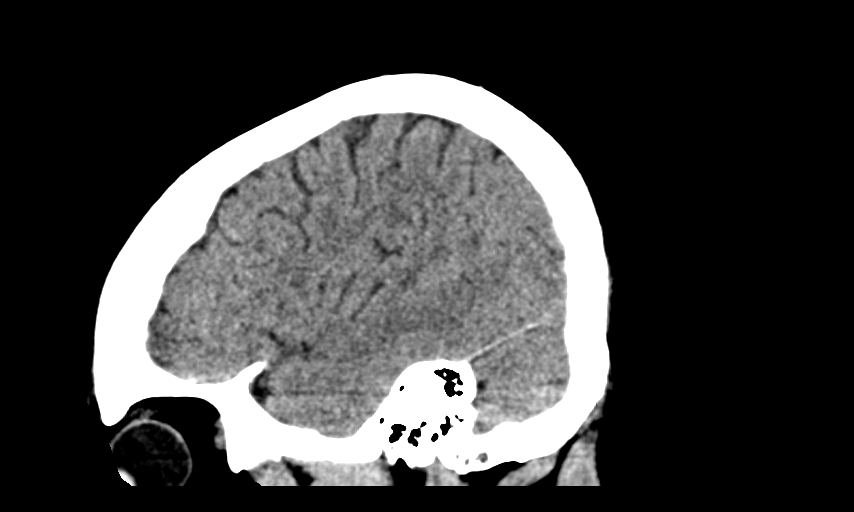
[im 34/67  brain]
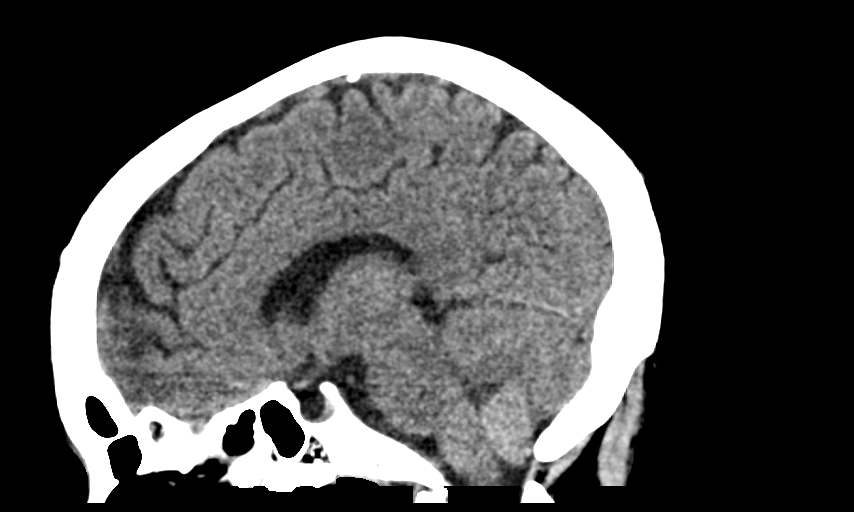
[im 45/67  brain]
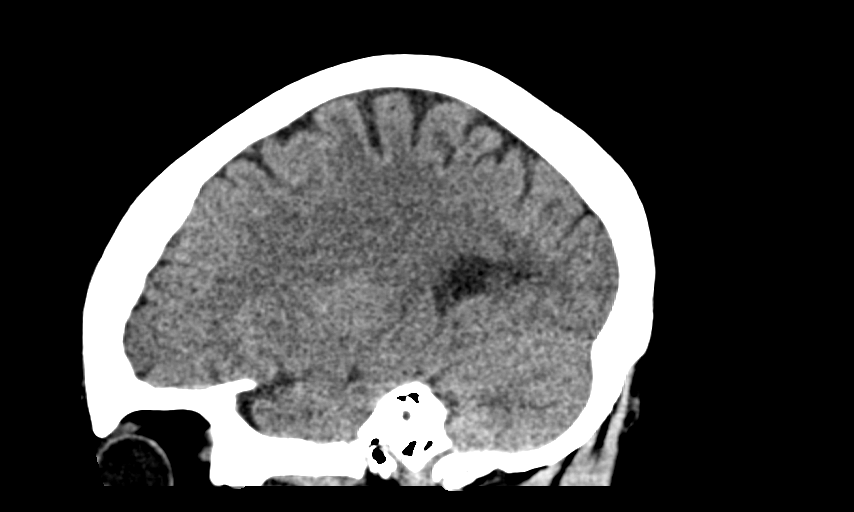

[14 of 47 positions shown; findings below may reference images not displayed]

FINDINGS: Brain: No evidence of acute infarction, hemorrhage, hydrocephalus,
extra-axial collection or mass lesion/mass effect.

Vascular: No hyperdense vessel or unexpected calcification.

Skull: Normal. Negative for fracture or focal lesion.

Sinuses/Orbits: No acute finding.

Other: None.
IMPRESSION: No acute abnormality noted.
